# Patient Record
Sex: Male | Born: 1960 | Hispanic: No | Marital: Married | Smoking: Former smoker
Health system: Southern US, Community
[De-identification: ages and names within clinical notes are randomized; demographics above are authoritative.]

## PROBLEM LIST (undated history)

## (undated) DIAGNOSIS — K219 Gastro-esophageal reflux disease without esophagitis: Secondary | ICD-10-CM

## (undated) DIAGNOSIS — M1990 Unspecified osteoarthritis, unspecified site: Secondary | ICD-10-CM

## (undated) DIAGNOSIS — T4145XA Adverse effect of unspecified anesthetic, initial encounter: Secondary | ICD-10-CM

## (undated) DIAGNOSIS — T8859XA Other complications of anesthesia, initial encounter: Secondary | ICD-10-CM

## (undated) DIAGNOSIS — I10 Essential (primary) hypertension: Secondary | ICD-10-CM

## (undated) HISTORY — PX: FRACTURE SURGERY: SHX138

## (undated) HISTORY — PX: OTHER SURGICAL HISTORY: SHX169

---

## 2011-02-09 DIAGNOSIS — D126 Benign neoplasm of colon, unspecified: Secondary | ICD-10-CM

## 2019-01-26 NOTE — Patient Instructions (Addendum)
Ruben Peterson  01/26/2019   Your procedure is scheduled on: 02-08-19  Report to [facility] Main  Entrance  Report to admitting at        [place]  AM    Call this number if you have problems the morning of surgery [phone]    Remember: Do not eat food or drink liquids :After Midnight.  BRUSH YOUR TEETH MORNING OF SURGERY AND RINSE YOUR MOUTH OUT, NO CHEWING GUM CANDY OR MINTS.     Take these medicines the morning of surgery with A SIP OF WATER: NONE                                You may not have any metal on your body including hair pins and              piercings  Do not wear jewelry,  lotions, powders or perfumes, deodorant        .              Men may shave face and neck.   Do not bring valuables to the hospital. [place].  Contacts, dentures or bridgework may not be worn into surgery.  Leave suitcase in the car. After surgery it may be brought to your room.               Please read over the following fact sheets you were given: _____________________________________________________________________          [facility] - Preparing for Surgery Before surgery, you can play an important role.  Because skin is not sterile, your skin needs to be as free of germs as possible.  You can reduce the number of germs on your skin by washing with CHG (chlorahexidine gluconate) soap before surgery.  CHG is an antiseptic cleaner which kills germs and bonds with the skin to continue killing germs even after washing. Please DO NOT use if you have an allergy to CHG or antibacterial soaps.  If your skin becomes reddened/irritated stop using the CHG and inform your nurse when you arrive at Short Stay. Do not shave (including legs and underarms) for at least 48 hours prior to the first CHG shower.  You may shave your face/neck. Please follow these instructions carefully:  1.  Shower with CHG Soap the night before surgery  and the  morning of Surgery.  2.  If you choose to wash your hair, wash your hair first as usual with your  normal  shampoo.  3.  After you shampoo, rinse your hair and body thoroughly to remove the  shampoo.                           4.  Use CHG as you would any other liquid soap.  You can apply chg directly  to the skin and wash                       Gently with a scrungie or clean washcloth.  5.  Apply the CHG Soap to your body ONLY FROM THE NECK DOWN.   Do not use on face/ open  Wound or open sores. Avoid contact with eyes, ears mouth and genitals (private parts).                       Wash face,  Genitals (private parts) with your normal soap.             6.  Wash thoroughly, paying special attention to the area where your surgery  will be performed.  7.  Thoroughly rinse your body with warm water from the neck down.  8.  DO NOT shower/wash with your normal soap after using and rinsing off  the CHG Soap.                9.  Pat yourself dry with a clean towel.            10.  Wear clean pajamas.            11.  Place clean sheets on your bed the night of your first shower and do not  sleep with pets. Day of Surgery : Do not apply any lotions/deodorants the morning of surgery.  Please wear clean clothes to the hospital/surgery center.  FAILURE TO FOLLOW THESE INSTRUCTIONS MAY RESULT IN THE CANCELLATION OF YOUR SURGERY PATIENT SIGNATURE_________________________________  NURSE SIGNATURE__________________________________  ________________________________________________________________________  WHAT IS A BLOOD TRANSFUSION? Blood Transfusion Information  A transfusion is the replacement of blood or some of its parts. Blood is made up of multiple cells which provide different functions.  Red blood cells carry oxygen and are used for blood loss replacement.  White blood cells fight against infection.  Platelets control bleeding.  Plasma helps clot  blood.  Other blood products are available for specialized needs, such as hemophilia or other clotting disorders. BEFORE THE TRANSFUSION  Who gives blood for transfusions?   Healthy volunteers who are fully evaluated to make sure their blood is safe. This is blood bank blood. Transfusion therapy is the safest it has ever been in the practice of medicine. Before blood is taken from a donor, a complete history is taken to make sure that person has no history of diseases nor engages in risky social behavior (examples are intravenous drug use or sexual activity with multiple partners). The donor's travel history is screened to minimize risk of transmitting infections, such as malaria. The donated blood is tested for signs of infectious diseases, such as HIV and hepatitis. The blood is then tested to be sure it is compatible with you in order to minimize the chance of a transfusion reaction. If you or a relative donates blood, this is often done in anticipation of surgery and is not appropriate for emergency situations. It takes many days to process the donated blood. RISKS AND COMPLICATIONS Although transfusion therapy is very safe and saves many lives, the main dangers of transfusion include:   Getting an infectious disease.  Developing a transfusion reaction. This is an allergic reaction to something in the blood you were given. Every precaution is taken to prevent this. The decision to have a blood transfusion has been considered carefully by your caregiver before blood is given. Blood is not given unless the benefits outweigh the risks. AFTER THE TRANSFUSION  Right after receiving a blood transfusion, you will usually feel much better and more energetic. This is especially true if your red blood cells have gotten low (anemic). The transfusion raises the level of the red blood cells which carry oxygen, and this usually causes an energy increase.  The  nurse administering the transfusion will monitor  you carefully for complications. HOME CARE INSTRUCTIONS  No special instructions are needed after a transfusion. You may find your energy is better. Speak with your caregiver about any limitations on activity for underlying diseases you may have. SEEK MEDICAL CARE IF:   Your condition is not improving after your transfusion.  You develop redness or irritation at the intravenous (IV) site. SEEK IMMEDIATE MEDICAL CARE IF:  Any of the following symptoms occur over the next 12 hours:  Shaking chills.  You have a temperature by mouth above 102 F (38.9 C), not controlled by medicine.  Chest, back, or muscle pain.  People around you feel you are not acting correctly or are confused.  Shortness of breath or difficulty breathing.  Dizziness and fainting.  You get a rash or develop hives.  You have a decrease in urine output.  Your urine turns a dark color or changes to pink, red, or brown. Any of the following symptoms occur over the next 10 days:  You have a temperature by mouth above 102 F (38.9 C), not controlled by medicine.  Shortness of breath.  Weakness after normal activity.  The white part of the eye turns yellow (jaundice).  You have a decrease in the amount of urine or are urinating less often.  Your urine turns a dark color or changes to pink, red, or brown. Document Released: 11/27/2000 Document Revised: 02/22/2012 Document Reviewed: 07/16/2008 ExitCare Patient Information 2014 [place].  _______________________________________________________________________  Incentive Spirometer  An incentive spirometer is a tool that can help keep your lungs clear and active. This tool measures how well you are filling your lungs with each breath. Taking long deep breaths may help reverse or decrease the chance of developing breathing (pulmonary) problems (especially infection) following:  A long period of time when you are unable to move or be active. BEFORE THE  PROCEDURE   If the spirometer includes an indicator to show your best effort, your nurse or respiratory therapist will set it to a desired goal.  If possible, sit up straight or lean slightly forward. Try not to slouch.  Hold the incentive spirometer in an upright position. INSTRUCTIONS FOR USE  1. Sit on the edge of your bed if possible, or sit up as far as you can in bed or on a chair. 2. Hold the incentive spirometer in an upright position. 3. Breathe out normally. 4. Place the mouthpiece in your mouth and seal your lips tightly around it. 5. Breathe in slowly and as deeply as possible, raising the piston or the ball toward the top of the column. 6. Hold your breath for 3-5 seconds or for as long as possible. Allow the piston or ball to fall to the bottom of the column. 7. Remove the mouthpiece from your mouth and breathe out normally. 8. Rest for a few seconds and repeat Steps 1 through 7 at least 10 times every 1-2 hours when you are awake. Take your time and take a few normal breaths between deep breaths. 9. The spirometer may include an indicator to show your best effort. Use the indicator as a goal to work toward during each repetition. 10. After each set of 10 deep breaths, practice coughing to be sure your lungs are clear. If you have an incision (the cut made at the time of surgery), support your incision when coughing by placing a pillow or rolled up towels firmly against it. Once you are able to get  out of bed, walk around indoors and cough well. You may stop using the incentive spirometer when instructed by your caregiver.  RISKS AND COMPLICATIONS  Take your time so you do not get dizzy or light-headed.  If you are in pain, you may need to take or ask for pain medication before doing incentive spirometry. It is harder to take a deep breath if you are having pain. AFTER USE  Rest and breathe slowly and easily.  It can be helpful to keep track of a log of your progress. Your  caregiver can provide you with a simple table to help with this. If you are using the spirometer at home, follow these instructions: [place] IF:   You are having difficultly using the spirometer.  You have trouble using the spirometer as often as instructed.  Your pain medication is not giving enough relief while using the spirometer.  You develop fever of 100.5 F (38.1 C) or higher. SEEK IMMEDIATE MEDICAL CARE IF:   You cough up bloody sputum that had not been present before.  You develop fever of 102 F (38.9 C) or greater.  You develop worsening pain at or near the incision site. MAKE SURE YOU:   Understand these instructions.  Will watch your condition.  Will get help right away if you are not doing well or get worse. Document Released: 04/12/2007 Document Revised: 02/22/2012 Document Reviewed: 06/13/2007 [employer] Patient Information 2014 [place], [place].   ________________________________________________________________________

## 2019-01-30 ENCOUNTER — Encounter (HOSPITAL_COMMUNITY): Payer: Self-pay | Admitting: Student

## 2019-01-30 NOTE — H&P (Signed)
TOTAL HIP ADMISSION H&P  Patient is admitted for left total hip arthroplasty.  Subjective:  Chief Complaint: left hip pain  HPI: TAL KEMPKER, 58 y.o. male, has a history of pain and functional disability in the left hip(s) due to trauma and arthritis and patient has failed non-surgical conservative treatments for greater than 12 weeks to include activity modification.  Onset of symptoms was gradual starting >10 years ago with gradually worsening course since that time.The patient noted prior procedures of the hip to include fibular grafting on the left hip(s).  Patient currently rates pain in the left hip at 8 out of 10 with activity. Patient has worsening of pain with activity and weight bearing and pain that interfers with activities of daily living. 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 This condition presents safety issues increasing the risk of falls. There is no current active infection.  There are no active problems to display for this patient.  History reviewed. No pertinent past medical history.  History reviewed. No pertinent surgical history.  No current facility-administered medications for this encounter.    Current Outpatient Medications  Medication Sig Dispense Refill Last Dose  . chlorthalidone (HYGROTON) 25 MG tablet Take 25 mg by mouth daily.     Marland Kitchen HYDROcodone-acetaminophen (NORCO/VICODIN) 5-325 MG tablet Take 1 tablet by mouth every 6 (six) hours as needed for moderate pain.     Marland Kitchen oxymetazoline (AFRIN) 0.05 % nasal spray Place 1 spray into both nostrils 2 (two) times daily as needed for congestion.     . traMADol (ULTRAM) 50 MG tablet Take 50 mg by mouth every 6 (six) hours as needed (pain).      Allergies  Allergen Reactions  . Benazepril Anaphylaxis  . Heparin  Other (See Comments)    Clots blood     Social History   Tobacco Use  . Smoking status: Not on file  Substance Use Topics  . Alcohol use: Not on file    No family history on file.   ROS Constitutional Constitutional: no fever, no significant weight gain, no significant weight loss  HEENT Eyes: no dry eyes, no irritation, no vision change, no sore throat  Cardiovascular Cardiovascular: no chest pain, no palpitations  Respiratory Respiratory: no cough, no shortness of breath, No COPD  Gastrointestinal Gastrointestinal: no vomiting, no diarrhea, not vomiting blood  Genitourinary Genitourinary: no blood in urine, no difficulty urinating  Musculoskeletal Musculoskeletal: no swelling in Joints, Joint Pain  Integumentary Skin: no rashes, no varicose veins  Neurologic Neurologic: no numbness, no seizures, no dizziness, no difficulty with balance  Endocrine temperature intolerance (normal) to heat  Hematologic/Lymphatic Hematologic/Lymphatic no swollen glands, no bruising Objective:  Physical Exam Well nourished and well developed. Antalgic gait pattern to the left.  General: Alert and oriented x3, cooperative and pleasant, no acute distress. Head: normocephalic, atraumatic, neck supple. Eyes: EOMI. Respiratory: breath sounds clear in all fields, no wheezing, rales, or rhonchi. Cardiovascular: Regular rate and rhythm, no murmurs, gallops or rubs.  Abdomen: non-tender to palpation and soft, normoactive bowel sounds. Musculoskeletal: Left Hip Exam: ROM: Flexion to 90, Internal Rotation 0, External Rotation 0, and abduction 0 without discomfort.  There is no tenderness over the greater trochanter bursa.   Calves soft and nontender. Motor function intact in LE. Strength 5/5 LE bilaterally.  Neuro: Distal pulses  2+. Sensation to light touch intact in LE.  Vital signs in last 24 hours: BP: 142/82 mmHg Pulse: 72 bpm  Labs:   There is no height or weight on file  to calculate BMI.   Imaging Review Plain radiographs demonstrate severe degenerative joint disease of the left hip(s). The bone quality appears to be adequate for age and reported activity level.      Assessment/Plan:  End stage arthritis, left hip(s)  The patient history, physical examination, clinical judgement of the provider and imaging studies are consistent with end stage degenerative joint disease of the left hip(s) and total hip arthroplasty is deemed medically necessary. The treatment options including medical management, injection therapy, arthroscopy and arthroplasty were discussed at length. The risks and benefits of total hip arthroplasty were presented and reviewed. The risks due to aseptic loosening, infection, stiffness, dislocation/subluxation,  thromboembolic complications and other imponderables were discussed.  The patient acknowledged the explanation, agreed to proceed with the plan and consent was signed. Patient is being admitted for inpatient treatment for surgery, pain control, PT, OT, prophylactic antibiotics, VTE prophylaxis, progressive ambulation and ADL's and discharge planning.The patient is planning to be discharged home.  Therapy Plans: HEP Disposition: Home with wife Planned DVT Prophylaxis: Xarelto 10mg  daily DME needed: none PCP: Dr. 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 Patient wears hearing aids.   - Patient was instructed on what medications to stop prior to surgery. - Follow-up visit in 2 weeks with Dr. Wynelle Link - Begin physical therapy following surgery - Pre-operative lab work as pre-surgical testing - Prescriptions will be provided in hospital at time of discharge  Griffith Citron, PA-C Orthopedic Surgery [facility] [place]

## 2019-02-03 ENCOUNTER — Encounter (HOSPITAL_COMMUNITY)
Admission: RE | Admit: 2019-02-03 | Discharge: 2019-02-03 | Disposition: A | Discharge status: Home / Self Care | Payer: PRIVATE HEALTH INSURANCE | Source: Ambulatory Visit | Attending: Orthopedic Surgery | Admitting: Orthopedic Surgery

## 2019-02-03 ENCOUNTER — Other Ambulatory Visit: Payer: Self-pay

## 2019-02-03 ENCOUNTER — Encounter (HOSPITAL_COMMUNITY): Payer: Self-pay

## 2019-02-03 DIAGNOSIS — Z01818 Encounter for other preprocedural examination: Secondary | ICD-10-CM | POA: Diagnosis not present

## 2019-02-03 HISTORY — DX: Adverse effect of unspecified anesthetic, initial encounter: T41.45XA

## 2019-02-03 HISTORY — DX: Essential (primary) hypertension: I10

## 2019-02-03 HISTORY — DX: Unspecified osteoarthritis, unspecified site: M19.90

## 2019-02-03 HISTORY — DX: Other complications of anesthesia, initial encounter: T88.59XA

## 2019-02-03 HISTORY — DX: Gastro-esophageal reflux disease without esophagitis: K21.9

## 2019-02-03 LAB — COMPREHENSIVE METABOLIC PANEL
ALT: 29 U/L (ref 0–44)
AST: 21 U/L (ref 15–41)
Albumin: 3.9 g/dL (ref 3.5–5.0)
Alkaline Phosphatase: 87 U/L (ref 38–126)
Anion gap: 6 (ref 5–15)
BUN: 13 mg/dL (ref 6–20)
CO2: 27 mmol/L (ref 22–32)
Calcium: 9.1 mg/dL (ref 8.9–10.3)
Chloride: 106 mmol/L (ref 98–111)
Creatinine, Ser: 0.98 mg/dL (ref 0.61–1.24)
GFR calc Af Amer: >60 mL/min (ref >60)
GFR calc non Af Amer: >60 mL/min (ref >60)
Glucose, Bld: 87 mg/dL (ref 70–99)
Potassium: 3.5 mmol/L (ref 3.5–5.1)
Sodium: 139 mmol/L (ref 135–145)
Total Bilirubin: 0.3 mg/dL (ref 0.3–1.2)
Total Protein: 7.7 g/dL (ref 6.5–8.1)

## 2019-02-03 LAB — CBC
HCT: 47.7 % (ref 39.0–52.0)
Hemoglobin: 14.5 g/dL (ref 13.0–17.0)
MCH: 28.2 pg (ref 26.0–34.0)
MCHC: 30.4 g/dL (ref 30.0–36.0)
MCV: 92.8 fL (ref 80.0–100.0)
Platelets: 201 10*3/uL (ref 150–400)
RBC: 5.14 MIL/uL (ref 4.22–5.81)
RDW: 13.6 % (ref 11.5–15.5)
WBC: 4.6 10*3/uL (ref 4.0–10.5)
nRBC: 0 % (ref 0.0–0.2)

## 2019-02-03 LAB — URINALYSIS, ROUTINE W REFLEX MICROSCOPIC
Bilirubin Urine: NEGATIVE
Glucose, UA: NEGATIVE mg/dL
Hgb urine dipstick: NEGATIVE
Ketones, ur: NEGATIVE mg/dL
Leukocytes,Ua: NEGATIVE
Nitrite: NEGATIVE
Protein, ur: NEGATIVE mg/dL
Specific Gravity, Urine: 1.023 (ref 1.005–1.030)
pH: 6 (ref 5.0–8.0)

## 2019-02-03 LAB — PROTIME-INR
INR: 1.03
Prothrombin Time: 13.4 seconds (ref 11.4–15.2)

## 2019-02-03 LAB — SURGICAL PCR SCREEN
MRSA, PCR: NEGATIVE
Staphylococcus aureus: NEGATIVE

## 2019-02-03 LAB — ABO/RH: ABO/RH(D): A POS

## 2019-02-03 LAB — APTT: aPTT: 29 seconds (ref 24–36)

## 2019-02-07 ENCOUNTER — Encounter (HOSPITAL_COMMUNITY): Payer: Self-pay | Admitting: Certified Registered Nurse Anesthetist

## 2019-02-07 NOTE — Anesthesia Preprocedure Evaluation (Addendum)
Anesthesia Evaluation  Patient identified by MRN, date of birth, ID band Patient awake    Reviewed: Allergy & Precautions, NPO status , Patient's Chart, lab work & pertinent test results  Airway Mallampati: III  TM Distance: >3 FB Neck ROM: Full    Dental  (+) Upper Dentures, Lower Dentures   Pulmonary former smoker,    Pulmonary exam normal breath sounds clear to auscultation       Cardiovascular hypertension, Normal cardiovascular exam Rhythm:Regular Rate:Normal  ECG: NSR, rate 62   Neuro/Psych negative neurological ROS  negative psych ROS   GI/Hepatic Neg liver ROS, GERD  Controlled,  Endo/Other  negative endocrine ROS  Renal/GU negative Renal ROS     Musculoskeletal  (+) Arthritis ,   Abdominal (+) + obese,   Peds  Hematology negative hematology ROS (+)   Anesthesia Other Findings left hip osteoarthritis  Reproductive/Obstetrics                            Anesthesia Physical Anesthesia Plan  ASA: II  Anesthesia Plan: General   Post-op Pain Management:    Induction: Intravenous  PONV Risk Score and Plan: 3 and Ondansetron, Midazolam, Dexamethasone and Treatment may vary due to age or medical condition  Airway Management Planned: Oral ETT  Additional Equipment:   Intra-op Plan:   Post-operative Plan: Extubation in OR  Informed Consent: I have reviewed the patients History and Physical, chart, labs and discussed the procedure including the risks, benefits and alternatives for the proposed anesthesia with the patient or authorized representative who has indicated his/her understanding and acceptance.     Dental advisory given  Plan Discussed with: CRNA  Anesthesia Plan Comments:         Anesthesia Quick Evaluation

## 2019-02-08 ENCOUNTER — Inpatient Hospital Stay (HOSPITAL_COMMUNITY): Payer: PRIVATE HEALTH INSURANCE

## 2019-02-08 ENCOUNTER — Inpatient Hospital Stay (HOSPITAL_COMMUNITY)
Admission: RE | Admit: 2019-02-08 | Discharge: 2019-02-10 | DRG: 470 | Disposition: A | Discharge status: Home / Self Care | Payer: PRIVATE HEALTH INSURANCE | Attending: Orthopedic Surgery | Admitting: Orthopedic Surgery

## 2019-02-08 ENCOUNTER — Encounter (HOSPITAL_COMMUNITY): Payer: Self-pay

## 2019-02-08 ENCOUNTER — Other Ambulatory Visit: Payer: Self-pay

## 2019-02-08 ENCOUNTER — Inpatient Hospital Stay (HOSPITAL_COMMUNITY): Payer: PRIVATE HEALTH INSURANCE | Admitting: Certified Registered Nurse Anesthetist

## 2019-02-08 ENCOUNTER — Encounter (HOSPITAL_COMMUNITY)
Admission: RE | Disposition: A | Discharge status: Home / Self Care | Payer: Self-pay | Source: Home / Self Care | Attending: Orthopedic Surgery

## 2019-02-08 ENCOUNTER — Inpatient Hospital Stay (HOSPITAL_COMMUNITY): Payer: PRIVATE HEALTH INSURANCE | Admitting: Physician Assistant

## 2019-02-08 DIAGNOSIS — I10 Essential (primary) hypertension: Secondary | ICD-10-CM | POA: Diagnosis present

## 2019-02-08 DIAGNOSIS — R9431 Abnormal electrocardiogram [ECG] [EKG]: Secondary | ICD-10-CM | POA: Diagnosis present

## 2019-02-08 DIAGNOSIS — M879 Osteonecrosis, unspecified: Secondary | ICD-10-CM | POA: Diagnosis present

## 2019-02-08 DIAGNOSIS — Z96649 Presence of unspecified artificial hip joint: Secondary | ICD-10-CM

## 2019-02-08 DIAGNOSIS — M87052 Idiopathic aseptic necrosis of left femur: Secondary | ICD-10-CM | POA: Diagnosis not present

## 2019-02-08 DIAGNOSIS — Z6830 Body mass index (BMI) 30.0-30.9, adult: Secondary | ICD-10-CM

## 2019-02-08 DIAGNOSIS — E669 Obesity, unspecified: Secondary | ICD-10-CM | POA: Diagnosis present

## 2019-02-08 DIAGNOSIS — M25752 Osteophyte, left hip: Secondary | ICD-10-CM | POA: Diagnosis present

## 2019-02-08 DIAGNOSIS — K219 Gastro-esophageal reflux disease without esophagitis: Secondary | ICD-10-CM | POA: Diagnosis present

## 2019-02-08 DIAGNOSIS — Z888 Allergy status to other drugs, medicaments and biological substances status: Secondary | ICD-10-CM

## 2019-02-08 DIAGNOSIS — R000 Tachycardia, unspecified: Secondary | ICD-10-CM | POA: Diagnosis present

## 2019-02-08 DIAGNOSIS — Z79899 Other long term (current) drug therapy: Secondary | ICD-10-CM | POA: Diagnosis not present

## 2019-02-08 DIAGNOSIS — M1612 Unilateral primary osteoarthritis, left hip: Secondary | ICD-10-CM | POA: Diagnosis present

## 2019-02-08 DIAGNOSIS — M25552 Pain in left hip: Secondary | ICD-10-CM | POA: Diagnosis present

## 2019-02-08 HISTORY — PX: TOTAL HIP ARTHROPLASTY: SHX124

## 2019-02-08 LAB — CBC WITH DIFFERENTIAL/PLATELET
Abs Immature Granulocytes: 0.03 10*3/uL (ref 0.00–0.07)
Basophils Absolute: 0 10*3/uL (ref 0.0–0.1)
Basophils Relative: 0 %
EOS PCT: 0 %
Eosinophils Absolute: 0 10*3/uL (ref 0.0–0.5)
HCT: 39.7 % (ref 39.0–52.0)
HEMOGLOBIN: 12.2 g/dL — AB (ref 13.0–17.0)
Immature Granulocytes: 0 %
LYMPHS PCT: 9 %
Lymphs Abs: 0.7 10*3/uL (ref 0.7–4.0)
MCH: 27.9 pg (ref 26.0–34.0)
MCHC: 30.7 g/dL (ref 30.0–36.0)
MCV: 90.6 fL (ref 80.0–100.0)
Monocytes Absolute: 0.2 10*3/uL (ref 0.1–1.0)
Monocytes Relative: 2 %
Neutro Abs: 6.4 10*3/uL (ref 1.7–7.7)
Neutrophils Relative %: 89 %
Platelets: 185 10*3/uL (ref 150–400)
RBC: 4.38 MIL/uL (ref 4.22–5.81)
RDW: 13.8 % (ref 11.5–15.5)
WBC: 7.3 10*3/uL (ref 4.0–10.5)
nRBC: 0 % (ref 0.0–0.2)

## 2019-02-08 LAB — TYPE AND SCREEN
ABO/RH(D): A POS
Antibody Screen: NEGATIVE

## 2019-02-08 LAB — COMPREHENSIVE METABOLIC PANEL
ALBUMIN: 3.4 g/dL — AB (ref 3.5–5.0)
ALK PHOS: 71 U/L (ref 38–126)
ALT: 27 U/L (ref 0–44)
AST: 33 U/L (ref 15–41)
Anion gap: 9 (ref 5–15)
BUN: 12 mg/dL (ref 6–20)
CO2: 21 mmol/L — ABNORMAL LOW (ref 22–32)
Calcium: 8.5 mg/dL — ABNORMAL LOW (ref 8.9–10.3)
Chloride: 105 mmol/L (ref 98–111)
Creatinine, Ser: 1.13 mg/dL (ref 0.61–1.24)
GFR calc Af Amer: >60 mL/min (ref >60)
GFR calc non Af Amer: >60 mL/min (ref >60)
Glucose, Bld: 178 mg/dL — ABNORMAL HIGH (ref 70–99)
Potassium: 3.9 mmol/L (ref 3.5–5.1)
Sodium: 135 mmol/L (ref 135–145)
Total Bilirubin: 0.3 mg/dL (ref 0.3–1.2)
Total Protein: 7 g/dL (ref 6.5–8.1)

## 2019-02-08 LAB — TROPONIN I: Troponin I: <0.03 ng/mL (ref <0.03)

## 2019-02-08 SURGERY — ARTHROPLASTY, HIP, TOTAL,POSTERIOR APPROACH
Anesthesia: General | Site: Hip | Laterality: Left

## 2019-02-08 MED ORDER — BUPIVACAINE HCL (PF) 0.25 % IJ SOLN
INTRAMUSCULAR | Status: AC
  Filled 2019-02-08: qty 30

## 2019-02-08 MED ORDER — LACTATED RINGERS IV SOLN
INTRAVENOUS | Status: DC
  Administered 2019-02-08 (×2): via INTRAVENOUS

## 2019-02-08 MED ORDER — ACETAMINOPHEN 10 MG/ML IV SOLN
1000.0000 mg | Freq: Four times a day (QID) | INTRAVENOUS | Status: DC
  Administered 2019-02-08: 1000 mg via INTRAVENOUS
  Filled 2019-02-08: qty 100

## 2019-02-08 MED ORDER — HYDROMORPHONE HCL 2 MG/ML IJ SOLN
INTRAMUSCULAR | Status: AC
  Filled 2019-02-08: qty 1

## 2019-02-08 MED ORDER — PROPOFOL 10 MG/ML IV BOLUS
INTRAVENOUS | Status: DC | PRN
  Administered 2019-02-08: 180 mg via INTRAVENOUS

## 2019-02-08 MED ORDER — FENTANYL CITRATE (PF) 100 MCG/2ML IJ SOLN
INTRAMUSCULAR | Status: AC
  Filled 2019-02-08: qty 2

## 2019-02-08 MED ORDER — RIVAROXABAN 10 MG PO TABS
10.0000 mg | ORAL_TABLET | Freq: Every day | ORAL | Status: DC
  Administered 2019-02-09 – 2019-02-10 (×2): 10 mg via ORAL
  Filled 2019-02-08 (×2): qty 1

## 2019-02-08 MED ORDER — ONDANSETRON HCL 4 MG/2ML IJ SOLN: 4.0000 mg | Freq: Four times a day (QID) | INTRAMUSCULAR | Status: DC | PRN

## 2019-02-08 MED ORDER — ACETAMINOPHEN 500 MG PO TABS
500.0000 mg | ORAL_TABLET | Freq: Four times a day (QID) | ORAL | Status: AC
  Administered 2019-02-09 (×3): 500 mg via ORAL
  Filled 2019-02-08 (×3): qty 1

## 2019-02-08 MED ORDER — CEFAZOLIN SODIUM-DEXTROSE 2-4 GM/100ML-% IV SOLN
2.0000 g | Freq: Four times a day (QID) | INTRAVENOUS | Status: AC
  Administered 2019-02-08 – 2019-02-09 (×2): 2 g via INTRAVENOUS
  Filled 2019-02-08 (×2): qty 100

## 2019-02-08 MED ORDER — METHOCARBAMOL 500 MG IVPB - SIMPLE MED
500.0000 mg | Freq: Four times a day (QID) | INTRAVENOUS | Status: DC | PRN
  Administered 2019-02-08: 500 mg via INTRAVENOUS
  Filled 2019-02-08: qty 50
  Filled 2019-02-08: qty 500

## 2019-02-08 MED ORDER — TRANEXAMIC ACID-NACL 1000-0.7 MG/100ML-% IV SOLN
1000.0000 mg | INTRAVENOUS | Status: AC
  Administered 2019-02-08: 1000 mg via INTRAVENOUS
  Filled 2019-02-08: qty 100

## 2019-02-08 MED ORDER — KETOROLAC TROMETHAMINE 30 MG/ML IJ SOLN: 30.0000 mg | Freq: Once | INTRAMUSCULAR | Status: DC | PRN

## 2019-02-08 MED ORDER — POLYETHYLENE GLYCOL 3350 17 G PO PACK: 17.0000 g | PACK | Freq: Every day | ORAL | Status: DC | PRN

## 2019-02-08 MED ORDER — PROMETHAZINE HCL 25 MG/ML IJ SOLN
6.2500 mg | INTRAMUSCULAR | Status: DC | PRN
  Administered 2019-02-08: 6.25 mg via INTRAVENOUS

## 2019-02-08 MED ORDER — HYDROCODONE-ACETAMINOPHEN 5-325 MG PO TABS
1.0000 | ORAL_TABLET | ORAL | Status: DC | PRN
  Administered 2019-02-08: 2 via ORAL
  Administered 2019-02-09: 1 via ORAL
  Administered 2019-02-09: 2 via ORAL
  Filled 2019-02-08 (×3): qty 2

## 2019-02-08 MED ORDER — SODIUM CHLORIDE 0.9 % IV BOLUS
500.0000 mL | Freq: Once | INTRAVENOUS | Status: AC
  Administered 2019-02-08: 500 mL via INTRAVENOUS

## 2019-02-08 MED ORDER — SUGAMMADEX SODIUM 200 MG/2ML IV SOLN
INTRAVENOUS | Status: DC | PRN
  Administered 2019-02-08: 200 mg via INTRAVENOUS

## 2019-02-08 MED ORDER — METOCLOPRAMIDE HCL 5 MG/ML IJ SOLN: 5.0000 mg | Freq: Three times a day (TID) | INTRAMUSCULAR | Status: DC | PRN

## 2019-02-08 MED ORDER — SODIUM CHLORIDE 0.9 % IV SOLN
INTRAVENOUS | Status: DC
  Administered 2019-02-08 – 2019-02-09 (×2): via INTRAVENOUS

## 2019-02-08 MED ORDER — HYDROMORPHONE HCL 1 MG/ML IJ SOLN: 0.2500 mg | INTRAMUSCULAR | Status: DC | PRN

## 2019-02-08 MED ORDER — CHLORHEXIDINE GLUCONATE 4 % EX LIQD: 60.0000 mL | Freq: Once | CUTANEOUS | Status: DC

## 2019-02-08 MED ORDER — CHLORTHALIDONE 25 MG PO TABS
25.0000 mg | ORAL_TABLET | Freq: Every day | ORAL | Status: DC
  Administered 2019-02-09 – 2019-02-10 (×2): 25 mg via ORAL
  Filled 2019-02-08 (×2): qty 1

## 2019-02-08 MED ORDER — STERILE WATER FOR IRRIGATION IR SOLN
Status: DC | PRN
  Administered 2019-02-08: 2000 mL

## 2019-02-08 MED ORDER — PROMETHAZINE HCL 25 MG/ML IJ SOLN
INTRAMUSCULAR | Status: AC
  Filled 2019-02-08: qty 1

## 2019-02-08 MED ORDER — METHOCARBAMOL 500 MG PO TABS: 500.0000 mg | ORAL_TABLET | Freq: Four times a day (QID) | ORAL | Status: DC | PRN

## 2019-02-08 MED ORDER — LIDOCAINE 2% (20 MG/ML) 5 ML SYRINGE
INTRAMUSCULAR | Status: AC
  Filled 2019-02-08: qty 5

## 2019-02-08 MED ORDER — ACETAMINOPHEN 500 MG PO TABS: 1000.0000 mg | ORAL_TABLET | Freq: Once | ORAL | Status: DC

## 2019-02-08 MED ORDER — FENTANYL CITRATE (PF) 100 MCG/2ML IJ SOLN
INTRAMUSCULAR | Status: DC | PRN
  Administered 2019-02-08 (×3): 100 ug via INTRAVENOUS

## 2019-02-08 MED ORDER — HYDROMORPHONE HCL 1 MG/ML IJ SOLN
INTRAMUSCULAR | Status: DC | PRN
  Administered 2019-02-08 (×2): 0.5 mg via INTRAVENOUS
  Administered 2019-02-08 (×2): 1 mg via INTRAVENOUS
  Administered 2019-02-08 (×2): 0.5 mg via INTRAVENOUS

## 2019-02-08 MED ORDER — DOCUSATE SODIUM 100 MG PO CAPS
100.0000 mg | ORAL_CAPSULE | Freq: Two times a day (BID) | ORAL | Status: DC
  Administered 2019-02-08 – 2019-02-10 (×4): 100 mg via ORAL
  Filled 2019-02-08 (×4): qty 1

## 2019-02-08 MED ORDER — ONDANSETRON HCL 4 MG PO TABS: 4.0000 mg | ORAL_TABLET | Freq: Four times a day (QID) | ORAL | Status: DC | PRN

## 2019-02-08 MED ORDER — ONDANSETRON HCL 4 MG/2ML IJ SOLN
INTRAMUSCULAR | Status: DC | PRN
  Administered 2019-02-08: 4 mg via INTRAVENOUS

## 2019-02-08 MED ORDER — ONDANSETRON HCL 4 MG/2ML IJ SOLN
INTRAMUSCULAR | Status: AC
  Filled 2019-02-08: qty 2

## 2019-02-08 MED ORDER — KETOROLAC TROMETHAMINE 30 MG/ML IJ SOLN
INTRAMUSCULAR | Status: AC
  Filled 2019-02-08: qty 1

## 2019-02-08 MED ORDER — 0.9 % SODIUM CHLORIDE (POUR BTL) OPTIME
TOPICAL | Status: DC | PRN
  Administered 2019-02-08: 1000 mL

## 2019-02-08 MED ORDER — DEXAMETHASONE SODIUM PHOSPHATE 10 MG/ML IJ SOLN
8.0000 mg | Freq: Once | INTRAMUSCULAR | Status: AC
  Administered 2019-02-08: 10 mg via INTRAVENOUS

## 2019-02-08 MED ORDER — CEFAZOLIN SODIUM-DEXTROSE 2-4 GM/100ML-% IV SOLN
2.0000 g | INTRAVENOUS | Status: AC
  Administered 2019-02-08: 2 g via INTRAVENOUS
  Filled 2019-02-08: qty 100

## 2019-02-08 MED ORDER — MENTHOL 3 MG MT LOZG
1.0000 | LOZENGE | OROMUCOSAL | Status: DC | PRN
  Filled 2019-02-08: qty 9

## 2019-02-08 MED ORDER — PROPOFOL 10 MG/ML IV BOLUS
INTRAVENOUS | Status: AC
  Filled 2019-02-08: qty 20

## 2019-02-08 MED ORDER — MORPHINE SULFATE (PF) 2 MG/ML IV SOLN: 0.5000 mg | INTRAVENOUS | Status: DC | PRN

## 2019-02-08 MED ORDER — SUGAMMADEX SODIUM 200 MG/2ML IV SOLN
INTRAVENOUS | Status: AC
  Filled 2019-02-08: qty 2

## 2019-02-08 MED ORDER — METOCLOPRAMIDE HCL 5 MG PO TABS: 5.0000 mg | ORAL_TABLET | Freq: Three times a day (TID) | ORAL | Status: DC | PRN

## 2019-02-08 MED ORDER — BUPIVACAINE HCL (PF) 0.25 % IJ SOLN
INTRAMUSCULAR | Status: DC | PRN
  Administered 2019-02-08: 30 mL

## 2019-02-08 MED ORDER — SODIUM CHLORIDE 0.9 % IV SOLN: INTRAVENOUS | Status: DC

## 2019-02-08 MED ORDER — PHENOL 1.4 % MT LIQD
1.0000 | OROMUCOSAL | Status: DC | PRN
  Filled 2019-02-08: qty 177

## 2019-02-08 MED ORDER — OXYCODONE HCL 5 MG/5ML PO SOLN: 5.0000 mg | Freq: Once | ORAL | Status: DC | PRN

## 2019-02-08 MED ORDER — FLEET ENEMA 7-19 GM/118ML RE ENEM: 1.0000 | ENEMA | Freq: Once | RECTAL | Status: DC | PRN

## 2019-02-08 MED ORDER — ROCURONIUM BROMIDE 100 MG/10ML IV SOLN
INTRAVENOUS | Status: AC
  Filled 2019-02-08: qty 1

## 2019-02-08 MED ORDER — DEXAMETHASONE SODIUM PHOSPHATE 10 MG/ML IJ SOLN
10.0000 mg | Freq: Once | INTRAMUSCULAR | Status: AC
  Administered 2019-02-09: 10 mg via INTRAVENOUS
  Filled 2019-02-08: qty 1

## 2019-02-08 MED ORDER — BISACODYL 10 MG RE SUPP: 10.0000 mg | Freq: Every day | RECTAL | Status: DC | PRN

## 2019-02-08 MED ORDER — ROCURONIUM BROMIDE 100 MG/10ML IV SOLN
INTRAVENOUS | Status: DC | PRN
  Administered 2019-02-08: 20 mg via INTRAVENOUS
  Administered 2019-02-08: 10 mg via INTRAVENOUS
  Administered 2019-02-08: 50 mg via INTRAVENOUS

## 2019-02-08 MED ORDER — METHOCARBAMOL 500 MG IVPB - SIMPLE MED
INTRAVENOUS | Status: AC
  Filled 2019-02-08: qty 50

## 2019-02-08 MED ORDER — LIDOCAINE 2% (20 MG/ML) 5 ML SYRINGE
INTRAMUSCULAR | Status: DC | PRN
  Administered 2019-02-08: 60 mg via INTRAVENOUS

## 2019-02-08 MED ORDER — OXYCODONE HCL 5 MG PO TABS: 5.0000 mg | ORAL_TABLET | Freq: Once | ORAL | Status: DC | PRN

## 2019-02-08 MED ORDER — DIPHENHYDRAMINE HCL 12.5 MG/5ML PO ELIX: 12.5000 mg | ORAL_SOLUTION | ORAL | Status: DC | PRN

## 2019-02-08 MED ORDER — HYDROCODONE-ACETAMINOPHEN 7.5-325 MG PO TABS
1.0000 | ORAL_TABLET | ORAL | Status: DC | PRN
  Administered 2019-02-08 – 2019-02-10 (×5): 2 via ORAL
  Filled 2019-02-08 (×5): qty 2

## 2019-02-08 MED ORDER — PROPOFOL 10 MG/ML IV BOLUS
INTRAVENOUS | Status: AC
  Filled 2019-02-08: qty 60

## 2019-02-08 MED ORDER — HYDROMORPHONE HCL 1 MG/ML IJ SOLN
INTRAMUSCULAR | Status: AC
  Filled 2019-02-08: qty 2

## 2019-02-08 SURGICAL SUPPLY — 69 items
BAG DECANTER FOR FLEXI CONT (MISCELLANEOUS) ×3 IMPLANT
BAG ZIPLOCK 12X15 (MISCELLANEOUS) IMPLANT
BIT DRILL 2.8X128 (BIT) ×2 IMPLANT
BIT DRILL 2.8X128MM (BIT) ×1
BLADE EXTENDED COATED 6.5IN (ELECTRODE) ×3 IMPLANT
BLADE SAW SAG 73X25 THK (BLADE) ×1
BLADE SAW SGTL 73X25 THK (BLADE) ×2 IMPLANT
BLADE SURG SZ10 CARB STEEL (BLADE) ×6 IMPLANT
BUR EGG DIAMOND 4.0 (BURR) ×2 IMPLANT
BUR EGG DIAMOND 4.0MM (BURR) ×1
BUR MICRO 7.0 ROUND ×3 IMPLANT
CLOSURE WOUND 1/2 X4 (GAUZE/BANDAGES/DRESSINGS) ×2
COVER SURGICAL LIGHT HANDLE (MISCELLANEOUS) ×3 IMPLANT
COVER WAND RF STERILE (DRAPES) IMPLANT
CUP ACET PINNACLE SECTR 56MM (Hips) ×1 IMPLANT
DECANTER SPIKE VIAL GLASS SM (MISCELLANEOUS) ×3 IMPLANT
DRAPE INCISE IOBAN 66X45 STRL (DRAPES) ×3 IMPLANT
DRAPE ORTHO SPLIT 77X108 STRL (DRAPES) ×4
DRAPE POUCH INSTRU U-SHP 10X18 (DRAPES) ×3 IMPLANT
DRAPE SURG ORHT 6 SPLT 77X108 (DRAPES) ×2 IMPLANT
DRAPE U-SHAPE 47X51 STRL (DRAPES) ×3 IMPLANT
DRSG ADAPTIC 3X8 NADH LF (GAUZE/BANDAGES/DRESSINGS) ×3 IMPLANT
DRSG MEPILEX BORDER 4X12 (GAUZE/BANDAGES/DRESSINGS) ×3 IMPLANT
DRSG MEPILEX BORDER 4X4 (GAUZE/BANDAGES/DRESSINGS) ×3 IMPLANT
DRSG MEPILEX BORDER 4X8 (GAUZE/BANDAGES/DRESSINGS) ×3 IMPLANT
DURAPREP 26ML APPLICATOR (WOUND CARE) ×3 IMPLANT
ELECT REM PT RETURN 15FT ADLT (MISCELLANEOUS) ×3 IMPLANT
EVACUATOR 1/8 PVC DRAIN (DRAIN) ×3 IMPLANT
FACESHIELD WRAPAROUND (MASK) ×12 IMPLANT
GAUZE SPONGE 4X4 12PLY STRL (GAUZE/BANDAGES/DRESSINGS) ×3 IMPLANT
GLOVE BIO SURGEON STRL SZ7 (GLOVE) ×3 IMPLANT
GLOVE BIO SURGEON STRL SZ8 (GLOVE) ×3 IMPLANT
GLOVE BIOGEL PI IND STRL 7.0 (GLOVE) ×2 IMPLANT
GLOVE BIOGEL PI IND STRL 8 (GLOVE) ×1 IMPLANT
GLOVE BIOGEL PI INDICATOR 7.0 (GLOVE) ×4
GLOVE BIOGEL PI INDICATOR 8 (GLOVE) ×2
GOWN STRL REUS W/TWL LRG LVL3 (GOWN DISPOSABLE) ×6 IMPLANT
HEAD CERAMIC BIOLOX 36 +3 (Hips) ×3 IMPLANT
IMMOBILIZER KNEE 20 (SOFTGOODS) ×3
IMMOBILIZER KNEE 20 THIGH 36 (SOFTGOODS) ×1 IMPLANT
KIT BASIN OR (CUSTOM PROCEDURE TRAY) ×3 IMPLANT
LINER MARATHON 4 NEUTRAL 36X56 (Hips) ×3 IMPLANT
MANIFOLD NEPTUNE II (INSTRUMENTS) ×3 IMPLANT
NDL SAFETY ECLIPSE 18X1.5 (NEEDLE) ×2 IMPLANT
NEEDLE HYPO 18GX1.5 SHARP (NEEDLE) ×4
NS IRRIG 1000ML POUR BTL (IV SOLUTION) ×3 IMPLANT
PACK TOTAL JOINT (CUSTOM PROCEDURE TRAY) ×3 IMPLANT
PADDING CAST COTTON 6X4 STRL (CAST SUPPLIES) ×3 IMPLANT
PASSER SUT SWANSON 36MM LOOP (INSTRUMENTS) ×3 IMPLANT
PINNACLE SECTOR CUP 56MM (Hips) ×3 IMPLANT
PROTECTOR NERVE ULNAR (MISCELLANEOUS) ×3 IMPLANT
ROUND BUR 7.0 ×3 IMPLANT
SLEEVE FEM PROX 22D SML (Hips) ×3 IMPLANT
STEM FEM SROM STD 17X22 36+12 (Hips) ×3 IMPLANT
STRIP CLOSURE SKIN 1/2X4 (GAUZE/BANDAGES/DRESSINGS) ×4 IMPLANT
SUT ETHIBOND NAB CT1 #1 30IN (SUTURE) ×6 IMPLANT
SUT MNCRL AB 4-0 PS2 18 (SUTURE) ×3 IMPLANT
SUT STRATAFIX 0 PDS 27 VIOLET (SUTURE) ×3
SUT VIC AB 2-0 CT1 27 (SUTURE) ×6
SUT VIC AB 2-0 CT1 TAPERPNT 27 (SUTURE) ×3 IMPLANT
SUTURE STRATFX 0 PDS 27 VIOLET (SUTURE) ×1 IMPLANT
SYR 20CC LL (SYRINGE) ×3 IMPLANT
SYR 50ML LL SCALE MARK (SYRINGE) IMPLANT
Stryker 4.0mm Egg Diamond ×3 IMPLANT
TOWEL OR 17X26 10 PK STRL BLUE (TOWEL DISPOSABLE) ×6 IMPLANT
TOWEL OR NON WOVEN STRL DISP B (DISPOSABLE) ×3 IMPLANT
TRAY FOLEY MTR SLVR 16FR STAT (SET/KITS/TRAYS/PACK) ×3 IMPLANT
WATER STERILE IRR 1000ML POUR (IV SOLUTION) ×3 IMPLANT
YANKAUER SUCT BULB TIP 10FT TU (MISCELLANEOUS) ×3 IMPLANT

## 2019-02-08 NOTE — Interval H&P Note (Signed)
History and Physical Interval Note:  02/08/2019 6:31 AM  Benay Pike Sr.  has presented today for surgery, with the diagnosis of left hip osteoarthritis  The various methods of treatment have been discussed with the patient and family. After consideration of risks, benefits and other options for treatment, the patient has consented to  Procedure(s) with comments: TOTAL HIP ARTHROPLASTY (Posterior) (Left) - 157min (please move other cases down 30min for Dr. Wynelle Link) as a surgical intervention .  The patient's history has been reviewed, patient examined, no change in status, stable for surgery.  I have reviewed the patient's chart and labs.  Questions were answered to the patient's satisfaction.     Pilar Plate Shiya Fogelman

## 2019-02-08 NOTE — Anesthesia Postprocedure Evaluation (Signed)
Anesthesia Post Note  Patient: Ruben Pike Sr.  Procedure(s) Performed: CONVERSION OF PREVIOUS HIP SURGERY TO TOTAL HIP ARTHROPLASTY (Posterior) (Left Hip)     Patient location during evaluation: PACU Anesthesia Type: General Level of consciousness: awake and alert Pain management: pain level controlled Vital Signs Assessment: post-procedure vital signs reviewed and stable Respiratory status: spontaneous breathing, nonlabored ventilation, respiratory function stable and patient connected to nasal cannula oxygen Cardiovascular status: blood pressure returned to baseline and stable Postop Assessment: no apparent nausea or vomiting Anesthetic complications: no    Last Vitals:  Vitals:   02/08/19 1337 02/08/19 1507  BP: (!) 144/94 (!) 135/100  Pulse: 90 96  Resp: 16 16  Temp: (!) 36.3 C   SpO2:  97%    Last Pain:  Vitals:   02/08/19 1553  TempSrc:   PainSc: 4                  Ryan P Ellender

## 2019-02-08 NOTE — Transfer of Care (Signed)
Immediate Anesthesia Transfer of Care Note  Patient: Ruben Pike Sr.  Procedure(s) Performed: CONVERSION OF PREVIOUS HIP SURGERY TO TOTAL HIP ARTHROPLASTY (Posterior) (Left Hip)  Patient Location: PACU  Anesthesia Type:General  Level of Consciousness: awake, alert  and oriented  Airway & Oxygen Therapy: Patient Spontanous Breathing and Patient connected to face mask oxygen  Post-op Assessment: Report given to RN and Post -op Vital signs reviewed and stable  Post vital signs: Reviewed and stable  Last Vitals:  Vitals Value Taken Time  BP 147/101 02/08/2019 11:02 AM  Temp    Pulse 85 02/08/2019 11:06 AM  Resp 10 02/08/2019 11:06 AM  SpO2 100 % 02/08/2019 11:06 AM  Vitals shown include unvalidated device data.  Last Pain:  Vitals:   02/08/19 0657  TempSrc:   PainSc: 0-No pain         Complications: No apparent anesthesia complications

## 2019-02-08 NOTE — Anesthesia Procedure Notes (Signed)
Procedure Name: Intubation Date/Time: 02/08/2019 8:28 AM Performed by: [place] Waqas Bruhl C, CRNA Pre-anesthesia Checklist: Patient identified, Emergency Drugs available, Suction available and Patient being monitored Patient Re-evaluated:Patient Re-evaluated prior to induction Oxygen Delivery Method: Circle system utilized Preoxygenation: Pre-oxygenation with 100% oxygen Induction Type: IV induction Ventilation: Mask ventilation without difficulty Laryngoscope Size: Mac, Glidescope and 3 Grade View: Grade I Tube type: Oral Number of attempts: 1 Airway Equipment and Method: Rigid stylet Placement Confirmation: ETT inserted through vocal cords under direct vision,  positive ETCO2 and breath sounds checked- equal and bilateral Secured at: 22 cm Tube secured with: Tape Dental Injury: Teeth and Oropharynx as per pre-operative assessment  Difficulty Due To: Difficulty was anticipated Comments: Pt with hx prior jaw surgery and limited mouth opening, therefore elective glidescope intubation performed easily and atraumatic

## 2019-02-08 NOTE — Consult Note (Signed)
[facility] Medical Consultation  Ruben Peterson PPI:[id number] DOB: 1961-09-28 DOA: 02/08/2019 PCP: [name], MD   Requesting physician: Dr. Lyla Glassing Date of consultation: 6:55 PM 02/08/19   Reason for consultation: tachycardia, abnormal ECG   Impression/Recommendations  Problems:    Tachycardia -sinus tachycardia in the setting of pain.  Administer IV fluids and pain control tachycardia has improved and currently heart rate back down to 80s.  No associated chest pain or shortness of breath. For  Completion we will obtain TSH Would continue to monitor and treat cause of tachycardia   Abnormal ECG -specific EKG changes in the setting of tachycardia resolved after tachycardia has improved.  Patient states he has no exertional chest pain or shortness of breath and has been fairly active no history of hyperlipidemia. Given dynamic changes on EKG rate dependent will monitor on telemetry check cardiac enzymes first troponin unremarkable.  Electrolytes within normal limits Given abnormal EKG check echogram probably would benefit from further cardiac evaluation will email cardiology let them know about the pt.     Osteonecrosis of hip ([place]) as  Per primary team   HTN -okay to hold off on BP meds for tonight pending further cardiac work-up may need different blood pressure medications choice at discharge   I will followup again tomorrow. Please contact me if I can be of assistance in the meanwhile. Thank you for this consultation.  Chief Complaint: tachycardia  HPI:  58 yo w HTN of Hypertension not on any meds, left hip osteoarthritis no Hx CAD Allergic to Heparin today undergone total hip arthroplasty. Since surgery he has been having some hip pain.  He also noted to be somewhat tachycardic up to 110 EKG was obtained that showed flipped T waves nonspecific changes he is not having any chest pain or shortness of breath only reporting hip pain.  Denies any history of tobacco  abuse alcohol abuse not taking any medications although have had history of hypertension.  No family history of coronary artery disease and he himself has no history of coronary artery disease. Review of Systems:    Review of Systems:    Pertinent positives include: hip pain  Constitutional:  No weight loss, night sweats, Fevers, chills, fatigue, weight loss  HEENT:  No headaches, Difficulty swallowing,Tooth/dental problems,Sore throat,  No sneezing, itching, ear ache, nasal congestion, post nasal drip,  Cardio-vascular:  No chest pain, Orthopnea, PND, anasarca, dizziness, palpitations.no Bilateral lower extremity swelling  GI:  No heartburn, indigestion, abdominal pain, nausea, vomiting, diarrhea, change in bowel habits, loss of appetite, melena, blood in stool, hematemesis Resp: no shortness of breath at rest or dyspnea on exertion  No excess mucus, no productive cough, No non-productive cough, No coughing up of blood.No change in color of mucus.No wheezing. Skin:  no rash or lesions. No jaundice GU:  no dysuria, change in color of urine, no urgency or frequency. No straining to urinate.  No flank pain.  Musculoskeletal:    No decreased range of motion. No back pain.  Psych:  No change in mood or affect. No depression or anxiety. No memory loss.  Neuro: no localizing neurological complaints, no tingling, no weakness, no double vision, no gait abnormality, no slurred speech, no confusion  All systems reviewed and apart from [place] all are negative  Past Medical History:  Diagnosis Date  . Arthritis   . Complication of anesthesia    pt. states he remember being intubated at [facility] and complains of  food feeling stuck  at times  . GERD (gastroesophageal reflux disease)    diet related  . Hypertension    Past Surgical History:  Procedure Laterality Date  . FRACTURE SURGERY     left  x3  with bone graft and screw  . jaw wired shu from fracture    . toes stretched left foot      Social History:  reports that he has quit smoking. He quit after 10.00 years of use. He has never used smokeless tobacco. He reports that he does not drink alcohol or use drugs.  Allergies  Allergen Reactions  . Benazepril Anaphylaxis  . Heparin Other (See Comments)    Clots blood    History reviewed. No pertinent family history.  Prior to Admission medications   Medication Sig Start Date End Date Taking? Authorizing Provider  chlorthalidone (HYGROTON) 25 MG tablet Take 25 mg by mouth daily.   Yes [provider]  HYDROcodone-acetaminophen (NORCO/VICODIN) 5-325 MG tablet Take 1 tablet by mouth every 6 (six) hours as needed for moderate pain.   Yes [provider]  traMADol (ULTRAM) 50 MG tablet Take 50 mg by mouth every 6 (six) hours as needed (pain).   Yes [provider]  oxymetazoline (AFRIN) 0.05 % nasal spray Place 1 spray into both nostrils 2 (two) times daily as needed for congestion.    [provider]   Physical Exam: Blood pressure (!) 130/97, pulse 100, temperature 98.5 F (36.9 C), temperature source Oral, resp. rate 16, height 5\' 7"  (1.702 m), weight 87.1 kg, SpO2 98 %. Vitals:   02/08/19 2152 02/09/19 0007  BP: 107/80 103/76  Pulse: 72 78  Resp: 17 18  Temp: 98.8 F (37.1 C) 98 F (36.7 C)  SpO2: 98% 96%    1. General:  in No Acute distress   well -appearing 2. Psychological: Alert and  Oriented 3. Head/ENT:    Dry Mucous Membranes                          Head Non traumatic, neck supple                           Poor Dentition 4. SKIN:   decreased Skin turgor,  Skin clean Dry and intact no rash 5. Heart: Regular rate and rhythm no  Murmur, no Rub or gallop 6. Lungs:  Clear to auscultation bilaterally, no wheezes or crackles   7. Abdomen: Soft,   Non distended  Obese bowel sounds present 8. Lower extremities: no clubbing, cyanosis, trace  edema 9. Neurologically Grossly intact, moving all 4 extremities equally  10.  MSK: Normal range of motion by recent surgical intervention patient is currently nonweightbearing   Labs on Admission:  Basic Metabolic Panel: Recent Labs  Lab 02/03/19 0830  NA 139  K 3.5  CL 106  CO2 27  GLUCOSE 87  BUN 13  CREATININE 0.98  CALCIUM 9.1   Liver Function Tests: Recent Labs  Lab 02/03/19 0830  AST 21  ALT 29  ALKPHOS 87  BILITOT 0.3  PROT 7.7  ALBUMIN 3.9   No results for input(s): LIPASE, AMYLASE in the last 168 hours. No results for input(s): AMMONIA in the last 168 hours. CBC: Recent Labs  Lab 02/03/19 0830  WBC 4.6  HGB 14.5  HCT 47.7  MCV 92.8  PLT 201   Cardiac Enzymes: No results for input(s): CKTOTAL, CKMB, CKMBINDEX, TROPONINI in  the last 168 hours. BNP: Invalid input(s): POCBNP CBG: No results for input(s): GLUCAP in the last 168 hours.  Radiological Exams on Admission: Dg Pelvis Portable  Result Date: 02/08/2019 CLINICAL DATA:  Left hip replacement, postoperative assessment EXAM: PORTABLE PELVIS 1-2 VIEWS COMPARISON:  01/19/2018 FINDINGS: A low centered frontal projection of the pelvis includes the entire a left hip prosthesis which appears well positioned an without periprosthetic fracture or immediate complicating feature. A drain is in place along with expected gas in the soft tissues. IMPRESSION: 1. Left total hip prosthesis placement without early complicating feature identified. Electronically Signed   By: Van Clines M.D.   On: 02/08/2019 13:53    EKG: Independently reviewed.  Initial EKG showing heart rate of 107 sinus rhythm with abnormal T waves and ST segment changes Repeat EKG showing heart rate of 83 sinus rhythm with improved ST segment changes and T waves  Time spent: 65 min  Ruben Peterson [facility]    If 7PM-7AM, please contact night-coverage [email or web address] Password [facility] 02/08/2019, 6:54 PM

## 2019-02-08 NOTE — Op Note (Signed)
Pre-operative diagnosis- Osteonecrosis Left hip  Post-operative diagnosis- Osteonecrosis  Left hip  Procedure-  Conversion of previous hip surgery to  LeftTotal Hip Arthroplasty  Surgeon- Dione Plover. Joie Reamer, MD  Assistant- Griffith Citron, PA-C   Anesthesia  General  EBL- 550 mL   Drain Hemovac   Complication- None  Condition-PACU - hemodynamically stable.   Brief Clinical Note- Ruben Peterson. is a 58 y.o. 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 Pain occurs with activity and rest including pain at night. He has tried analgesics, protected weight bearing and rest without benefit. Pain is too severe to attempt physical therapy. Radiographs demonstrate bone on bone arthritis with subchondral cyst formation. He presents now for left THA.  Procedure in detail-   The patient is brought into the operating room and placed on the operating table. After successful administration of General  anesthesia, the patient is placed in the  Right lateral decubitus position with the  Left side up and held in place with the hip positioner. The lower extremity is isolated from the perineum with plastic drapes and time-out is performed by the surgical team. The lower extremity is then prepped and draped in the usual sterile fashion. A short posterolateral incision is made with a ten blade through the subcutaneous tissue to the level of the fascia lata which is incised in line with the skin incision. The sciatic nerve is palpated and protected and the short external rotators and capsule are isolated from the femur. The hip is then dislocated and the center of the femoral head is marked. A trial prosthesis is placed such that the trial head corresponds to the center of the patients' native femoral head. The resection level is marked on the femoral neck and the resection is made with an oscillating saw. The femoral head is removed and femoral retractors  placed to gain access to the femoral canal. He had a previous vascularized fibular graft that was blocking access to the femoral canal. A high speed burr is used to decorticate the graft to gain access to the femoral canal and to allow for lateralization of the femoral stem. A screw is removed also from the proximal femur to allow access into the canal.      The canal finder is passed into the femoral canal and the canal is thoroughly irrigated with sterile saline to remove the fatty contents. Axial reaming is performed to 17.5  mm, proximal reaming to 22 B  and the sleeve machined to a small. A 22 B small trial sleeve is placed into the proximal femur.      The femur is then retracted anteriorly to gain acetabular exposure. Acetabular retractors are placed and the labrum and osteophytes are removed, Acetabular reaming is performed to 55  mm and a 56  mm Pinnacle acetabular shell is placed in anatomic position with excellent purchase. Additional dome screws were not needed. The permanent 36 mm neutral + 4 Marathon liner is placed into the acetabular shell.      The trial femur is then placed into the femoral canal. The size is 22 x 17  stem with a 36 + 12  neck and a 36 + 3 head with the neck version matching  the patients' native anteversion. The hip is reduced with excellent stability with full extension and full external rotation, 70 degrees flexion with 40 degrees adduction and 90 degrees internal rotation and 90 degrees of flexion with 70  degrees of internal rotation. The operative leg is placed on top of the non-operative leg and the leg lengths are found to be equal. The trials are then removed and the permanent implant of the same size is impacted into the femoral canal. The 32 + 3 ceramic, metal) femoral head of the same size as the trial is placed and the hip is reduced with the same stability parameters. The operative leg is again placed on top of the non-operative leg and the leg lengths are found to  be equal.      The wound is then copiously irrigated with saline solution and the capsule and short external rotators are re-attached to the femur through drill holes with Ethibond suture. The fascia lata is closed over a hemovac drain with 0 Stratofix suture and the fascia lata, gluteal muscles and subcutaneous tissues are injected with 30 ml of .25% Marcaine. The subcutaneous tissues are closed 2-0 vicryl and the subcuticular layer closed with running 4-0 Monocryl. The drain is hooked to suction, incision cleaned and dried, and steri-srips and a bulky sterile dressing applied. The limb is placed into a knee immobilizer and the patient is awakened and transported to recovery in stable condition.      Please note that a surgical assistant was a medical necessity for this procedure in order to perform it in a safe and expeditious manner. The assistant was necessary to provide retraction to the vital neurovascular structures and to retract and position the limb to allow for anatomic placement of the prosthetic components.  Dione Plover Lashawne Dura, MD    02/08/2019, 10:28 AM

## 2019-02-08 NOTE — Progress Notes (Signed)
Noted patient's pulse rate elevated and sustaining at 115 with frequent jumps into the 120s. Patient denies history of tachycardia, does not take medications for pulse. Reports pain as dull and tolerable. Dr Wynelle Link present in room shortly after, informed about pulse and BP. Rec'd order for EKG. On call physician notified of results. Hospitalist consulted. Will cont to monitor.
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
## 2019-02-09 ENCOUNTER — Encounter (HOSPITAL_COMMUNITY): Payer: Self-pay | Admitting: Cardiology

## 2019-02-09 ENCOUNTER — Inpatient Hospital Stay (HOSPITAL_COMMUNITY): Payer: PRIVATE HEALTH INSURANCE

## 2019-02-09 DIAGNOSIS — R000 Tachycardia, unspecified: Secondary | ICD-10-CM

## 2019-02-09 DIAGNOSIS — R9431 Abnormal electrocardiogram [ECG] [EKG]: Secondary | ICD-10-CM

## 2019-02-09 DIAGNOSIS — M87052 Idiopathic aseptic necrosis of left femur: Secondary | ICD-10-CM

## 2019-02-09 LAB — BASIC METABOLIC PANEL
Anion gap: 7 (ref 5–15)
BUN: 13 mg/dL (ref 6–20)
CHLORIDE: 106 mmol/L (ref 98–111)
CO2: 24 mmol/L (ref 22–32)
Calcium: 8.2 mg/dL — ABNORMAL LOW (ref 8.9–10.3)
Creatinine, Ser: 1.08 mg/dL (ref 0.61–1.24)
GFR calc Af Amer: >60 mL/min (ref >60)
GFR calc non Af Amer: >60 mL/min (ref >60)
Glucose, Bld: 130 mg/dL — ABNORMAL HIGH (ref 70–99)
Potassium: 3.9 mmol/L (ref 3.5–5.1)
SODIUM: 137 mmol/L (ref 135–145)

## 2019-02-09 LAB — LIPID PANEL
Cholesterol: 175 mg/dL (ref 0–200)
HDL: 41 mg/dL (ref >40)
LDL Cholesterol: 125 mg/dL — ABNORMAL HIGH (ref 0–99)
Total CHOL/HDL Ratio: 4.3 RATIO
Triglycerides: 44 mg/dL (ref <150)
VLDL: 9 mg/dL (ref 0–40)

## 2019-02-09 LAB — TSH: TSH: 0.592 u[IU]/mL (ref 0.350–4.500)

## 2019-02-09 LAB — CBC
HCT: 36.8 % — ABNORMAL LOW (ref 39.0–52.0)
Hemoglobin: 11.5 g/dL — ABNORMAL LOW (ref 13.0–17.0)
MCH: 28.5 pg (ref 26.0–34.0)
MCHC: 31.3 g/dL (ref 30.0–36.0)
MCV: 91.1 fL (ref 80.0–100.0)
Platelets: 150 10*3/uL (ref 150–400)
RBC: 4.04 MIL/uL — ABNORMAL LOW (ref 4.22–5.81)
RDW: 14 % (ref 11.5–15.5)
WBC: 8.6 10*3/uL (ref 4.0–10.5)
nRBC: 0 % (ref 0.0–0.2)

## 2019-02-09 LAB — HEMOGLOBIN A1C
Hgb A1c MFr Bld: 5.5 % (ref 4.8–5.6)
Mean Plasma Glucose: 111.15 mg/dL

## 2019-02-09 LAB — TROPONIN I: Troponin I: <0.03 ng/mL (ref <0.03)

## 2019-02-09 NOTE — Progress Notes (Signed)
PROGRESS NOTE    Ruben Peterson  MWU:[id number] DOB: [birth year] DOA: 02/08/2019 PCP: [name], MD    Brief Narrative: 58 yo w HTN of Hypertension not on any meds, left hip osteoarthritis no Hx CAD, underwent total hip arthroplasty, presented with tachycardia and abnormal EKG post procedure. Medical service and cardiology on board.   Assessment & Plan:   Principal Problem:   Aseptic necrosis of bone of hip, left (HCC) Active Problems:   Osteonecrosis of hip (HCC)   Abnormal ECG   Tachycardia   Aseptic necrosis of the left hip s/p total hip arthroplasty on 02/08/2019. - further management as per orthopedics.    Tachycardia:  EKG shows sinus tachycardia.  Pt remains asymptomatic.  Overnight, echocardiogram ordered for evaluation.  But pt denies any chest pain or sob.     Hypertension:  Resume home meds on discharge.    DVT prophylaxis:xarelto.  Code Status: full code.  Family Communication: family at bedside.   Disposition Plan: peer orthopedics.   Antimicrobials:NOne.   Subjective: Pt reports pain at the site of the surgery.   Objective: Vitals:   02/08/19 2152 02/09/19 0007 02/09/19 0623 02/09/19 0956  BP: 107/80 103/76 110/75 117/80  Pulse: 72 78 75 95  Resp: 17 18 18 16   Temp: 98.8 F (37.1 C) 98 F (36.7 C) 98.3 F (36.8 C) 98.7 F (37.1 C)  TempSrc: Oral Oral Oral Oral  SpO2: 98% 96% 99% 98%  Weight:      Height:        Intake/Output Summary (Last 24 hours) at 02/09/2019 1218 Last data filed at 02/09/2019 1003 Gross per 24 hour  Intake 3145.69 ml  Output 3145 ml  Net 0.69 ml   Filed Weights   02/08/19 0657  Weight: 87.1 kg    Examination:  General exam: Appears calm and comfortable  Respiratory system: Clear to auscultation. Respiratory effort normal. Cardiovascular system: S1 & S2 heard, tachycardic, regular.  No pedal edema. Gastrointestinal system: Abdomen is nondistended, soft and nontender. No organomegaly or masses  felt. Normal bowel sounds heard. Central nervous system: Alert and oriented. No focal neurological deficits. Extremities: left hip surgery site bandaged, with some bleeding in to the bandage. Tenderness on ambulation.  Skin: No rashes, lesions or ulcers Psychiatry:  Mood & affect appropriate.     Data Reviewed: I have personally reviewed following labs and imaging studies  CBC: Recent Labs  Lab 02/03/19 0830 02/08/19 1929 02/09/19 0132  WBC 4.6 7.3 8.6  NEUTROABS  --  6.4  --   HGB 14.5 12.2* 11.5*  HCT 47.7 39.7 36.8*  MCV 92.8 90.6 91.1  PLT 201 185 725   Basic Metabolic Panel: Recent Labs  Lab 02/03/19 0830 02/08/19 1929 02/09/19 0132  NA 139 135 137  K 3.5 3.9 3.9  CL 106 105 106  CO2 27 21* 24  GLUCOSE 87 178* 130*  BUN 13 12 13   CREATININE 0.98 1.13 1.08  CALCIUM 9.1 8.5* 8.2*   GFR: Estimated Creatinine Clearance: 79.5 mL/min (by C-G formula based on SCr of 1.08 mg/dL). Liver Function Tests: Recent Labs  Lab 02/03/19 0830 02/08/19 1929  AST 21 33  ALT 29 27  ALKPHOS 87 71  BILITOT 0.3 0.3  PROT 7.7 7.0  ALBUMIN 3.9 3.4*   No results for input(s): LIPASE, AMYLASE in the last 168 hours. No results for input(s): AMMONIA in the last 168 hours. Coagulation Profile: Recent Labs  Lab 02/03/19 0830  INR 1.03  Cardiac Enzymes: Recent Labs  Lab 02/08/19 1929 02/09/19 0132 02/09/19 0739  TROPONINI <0.03 <0.03 <0.03   BNP (last 3 results) No results for input(s): PROBNP in the last 8760 hours. HbA1C: Recent Labs    02/09/19 0739  HGBA1C 5.5   CBG: No results for input(s): GLUCAP in the last 168 hours. Lipid Profile: Recent Labs    02/09/19 0739  CHOL 175  HDL 41  LDLCALC 125*  TRIG 44  CHOLHDL 4.3   Thyroid Function Tests: Recent Labs    02/09/19 0739  TSH 0.592   Anemia Panel: No results for input(s): VITAMINB12, FOLATE, FERRITIN, TIBC, IRON, RETICCTPCT in the last 72 hours. Sepsis Labs: No results for input(s):  PROCALCITON, LATICACIDVEN in the last 168 hours.  Recent Results (from the past 240 hour(s))  Surgical pcr screen     Status: None   Collection Time: 02/03/19  8:12 AM  Result Value Ref Range Status   MRSA, PCR NEGATIVE NEGATIVE Final   Staphylococcus aureus NEGATIVE NEGATIVE Final    Comment: (NOTE) The Xpert SA Assay (FDA approved for NASAL specimens in patients 59 years of age and older), is one component of a comprehensive surveillance program. It is not intended to diagnose infection nor to guide or monitor treatment. Performed at [facility], [place] [address], [place], [place] [address]          Radiology Studies: Dg Pelvis Portable  Result Date: 02/08/2019 CLINICAL DATA:  Left hip replacement, postoperative assessment EXAM: PORTABLE PELVIS 1-2 VIEWS COMPARISON:  01/19/2018 FINDINGS: A low centered frontal projection of the pelvis includes the entire a left hip prosthesis which appears well positioned an without periprosthetic fracture or immediate complicating feature. A drain is in place along with expected gas in the soft tissues. IMPRESSION: 1. Left total hip prosthesis placement without early complicating feature identified. Electronically Signed   By: Van Clines M.D.   On: 02/08/2019 13:53   Dg Chest Port 1 View  Result Date: 02/08/2019 CLINICAL DATA:  Initial evaluation for acute shortness of breath, hypertension, abnormal ECG. EXAM: PORTABLE CHEST 1 VIEW COMPARISON:  None available. FINDINGS: Cardiac and mediastinal silhouettes are within normal limits. Lungs are normally inflated. Minimal left basilar subsegmental atelectasis. No other focal airspace disease. No edema or effusion. No pneumothorax. No acute osseous finding. IMPRESSION: 1. Mild left basilar subsegmental atelectasis. 2. No other active cardiopulmonary disease. Electronically Signed   By: Jeannine Boga M.D.   On: 02/08/2019 20:09        Scheduled Meds: . acetaminophen   500 mg Oral Q6H  . chlorthalidone  25 mg Oral Daily  . docusate sodium  100 mg Oral BID  . rivaroxaban  10 mg Oral Q breakfast   Continuous Infusions: . sodium chloride 100 mL/hr at 02/09/19 0253  . methocarbamol (ROBAXIN) IV Stopped (02/08/19 1525)     LOS: 1 day    Time spent:31 minutes.     Hosie Poisson, MD [facility] Pager [phone]   If 7PM-7AM, please contact night-coverage [email or web address] Password [email or web address] 02/09/2019, 12:18 PM

## 2019-02-09 NOTE — Progress Notes (Signed)
  Echocardiogram 2D Echocardiogram has been attempted. Canceled per Nurse.  Ruben Peterson 02/09/2019, 2:44 PM
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
## 2019-02-09 NOTE — Consult Note (Addendum)
Cardiology Consultation:   Patient ID: Ruben DUVE Sr. MRN: [id number]; DOB: 1961/04/20  Admit date: 02/08/2019 Date of Consult: 02/09/2019  Primary Care Provider: [name], MD Primary Cardiologist: No primary care provider on file.  Primary Electrophysiologist:  None   Patient Profile:   Ruben TALLO Sr. is a 58 y.o. male with a hx of hypertension, GERD, arthritis who is being seen today for the evaluation of tachycardia at the request of Dr. Roel Cluck.  History of Present Illness:   Ruben Peterson is no prior cardiac history.  He underwent total hip arthroplasty on 02/08/2019.  He subsequently developed transient tachycardia with heart rate up to 110.  EKG showed flipped T waves and nonspecific changes.  The patient had no chest pain or shortness of breath.  His troponins were negative.  Upon my assessment the patient tells me that he has no prior history of cardiac issues.  He was evaluated for epigastric discomfort about 10 years ago but this resolved with burping and he felt was related to GI issues.  He says he did not have a stress test at that time.  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  With his daily activities he has not had any chest pain/pressure/tightness or shortness of breath.  He denies orthopnea, PND, peripheral edema, palpitations.  He currently denies any chest discomfort or shortness of breath.  [name] has hypertension but does not take his antihypertensive daily due to increase in urination.  He says that he checks his blood pressure every day and when it starts to go up he takes his medicine.  The patient has a history of smoking having quit approximately 20 years ago.  He does not drink alcohol currently.  He does not know many specifics about his family history but he does know that his mother got cardiac stents last year at the age of 50.  He knows of no other cardiac history  in his father or 6 siblings.   Past Medical History:  Diagnosis Date  . Arthritis   . Complication of anesthesia    pt. states he remember being intubated at [facility] and complains of  food feeling stuck at times  . GERD (gastroesophageal reflux disease)    diet related  . Hypertension     Past Surgical History:  Procedure Laterality Date  . FRACTURE SURGERY     left  x3  with bone graft and screw  . jaw wired shu from fracture    . toes stretched left foot       Home Medications:  Prior to Admission medications   Medication Sig Start Date End Date Taking? Authorizing Provider  chlorthalidone (HYGROTON) 25 MG tablet Take 25 mg by mouth daily.   Yes [provider]  HYDROcodone-acetaminophen (NORCO/VICODIN) 5-325 MG tablet Take 1 tablet by mouth every 6 (six) hours as needed for moderate pain.   Yes [provider]  traMADol (ULTRAM) 50 MG tablet Take 50 mg by mouth every 6 (six) hours as needed (pain).   Yes [provider]  oxymetazoline (AFRIN) 0.05 % nasal spray Place 1 spray into both nostrils 2 (two) times daily as needed for congestion.    [provider]    Inpatient Medications: Scheduled Meds: . acetaminophen  500 mg Oral Q6H  . chlorthalidone  25 mg Oral Daily  . docusate sodium  100 mg Oral BID  .  rivaroxaban  10 mg Oral Q breakfast   Continuous Infusions: . sodium chloride 100 mL/hr at 02/09/19 0253  . methocarbamol (ROBAXIN) IV Stopped (02/08/19 1525)   PRN Meds: bisacodyl, diphenhydrAMINE, HYDROcodone-acetaminophen, HYDROcodone-acetaminophen, menthol-cetylpyridinium **OR** phenol, methocarbamol **OR** methocarbamol (ROBAXIN) IV, metoCLOPramide **OR** metoCLOPramide (REGLAN) injection, morphine injection, ondansetron **OR** ondansetron (ZOFRAN) IV, polyethylene glycol, sodium phosphate  Allergies:    Allergies  Allergen Reactions  . Benazepril Anaphylaxis  . Heparin Other (See Comments)    Clots blood     Social  History:   Social History   Socioeconomic History  . Marital status: Married    Spouse name: Not on file  . Number of children: Not on file  . Years of education: Not on file  . Highest education level: Not on file  Occupational History  . Not on file  Social Needs  . Financial resource strain: Not on file  . Food insecurity:    Worry: Not on file    Inability: Not on file  . Transportation needs:    Medical: Not on file    Non-medical: Not on file  Tobacco Use  . Smoking status: Former Smoker    Years: 10.00  . Smokeless tobacco: Never Used  . Tobacco comment: quit  25 years   Substance and Sexual Activity  . Alcohol use: Not Currently    Frequency: Never  . Drug use: Never  . Sexual activity: Yes  Lifestyle  . Physical activity:    Days per week: Not on file    Minutes per session: Not on file  . Stress: Not on file  Relationships  . Social connections:    Talks on phone: Not on file    Gets together: Not on file    Attends religious service: Not on file    Active member of club or organization: Not on file    Attends meetings of clubs or organizations: Not on file    Relationship status: Not on file  . Intimate partner violence:    Fear of current or ex partner: Not on file    Emotionally abused: Not on file    Physically abused: Not on file    Forced sexual activity: Not on file  Other Topics Concern  . Not on file  Social History Narrative  . Not on file    Family History:    Family History  Problem Relation Age of Onset  . CAD Mother        Cardiac stent at age 23  . Cancer Neg Hx   . Hypertension Neg Hx      ROS:  Please see the history of present illness.   All other ROS reviewed and negative.     Physical Exam/Data:   Vitals:   02/08/19 2152 02/09/19 0007 02/09/19 0623 02/09/19 0956  BP: 107/80 103/76 110/75 117/80  Pulse: 72 78 75 95  Resp: 17 18 18 16   Temp: 98.8 F (37.1 C) 98 F (36.7 C) 98.3 F (36.8 C) 98.7 F (37.1 C)    TempSrc: Oral Oral Oral Oral  SpO2: 98% 96% 99% 98%  Weight:      Height:        Intake/Output Summary (Last 24 hours) at 02/09/2019 1016 Last data filed at 02/09/2019 0600 Gross per 24 hour  Intake 3115.69 ml  Output 3145 ml  Net -29.31 ml   Last 3 Weights 02/08/2019 02/03/2019  Weight (lbs) 192 lb 192 lb  Weight (kg) 87.091 kg 87.091 kg  Body mass index is 30.07 kg/m.  General:  Well nourished, well developed, in no acute distress HEENT: normal Lymph: no adenopathy Neck: no JVD Endocrine:  No thryomegaly Vascular: No carotid bruits; FA pulses 2+ bilaterally without bruits  Cardiac:  normal S1, S2; RRR; no murmur  Lungs:  clear to auscultation bilaterally, no wheezing, rhonchi or rales  Abd: soft, nontender, no hepatomegaly  Ext: no edema Musculoskeletal:  No deformities, BUE and BLE strength normal and equal.  Surgical bandage left hip. Skin: warm and dry  Neuro:  CNs 2-12 intact, no focal abnormalities noted Psych:  Normal affect   EKG:  The EKG was personally reviewed and demonstrates:  02/03/2019-NSR 62 bpm with early r wave progression 02/08/2019 18:30- Sinus tachycardia, 107 bpm, mild TWI in leads I, II, III, aVF, V4, V5, V6 12/09/19 23:21- Sinus rhythm, 83 bpm, early r wave progression, nonspecific T wave changes inferior-lateral. Telemetry:  Telemetry was personally reviewed and demonstrates:  NSR 70's-80's  Relevant CV Studies:  Echo pending  Laboratory Data:  Chemistry Recent Labs  Lab 02/03/19 0830 02/08/19 1929 02/09/19 0132  NA 139 135 137  K 3.5 3.9 3.9  CL 106 105 106  CO2 27 21* 24  GLUCOSE 87 178* 130*  BUN 13 12 13   CREATININE 0.98 1.13 1.08  CALCIUM 9.1 8.5* 8.2*  GFRNONAA >60 >60 >60  GFRAA >60 >60 >60  ANIONGAP 6 9 7     Recent Labs  Lab 02/03/19 0830 02/08/19 1929  PROT 7.7 7.0  ALBUMIN 3.9 3.4*  AST 21 33  ALT 29 27  ALKPHOS 87 71  BILITOT 0.3 0.3   Hematology Recent Labs  Lab 02/03/19 0830 02/08/19 1929  02/09/19 0132  WBC 4.6 7.3 8.6  RBC 5.14 4.38 4.04*  HGB 14.5 12.2* 11.5*  HCT 47.7 39.7 36.8*  MCV 92.8 90.6 91.1  MCH 28.2 27.9 28.5  MCHC 30.4 30.7 31.3  RDW 13.6 13.8 14.0  PLT 201 185 150   Cardiac Enzymes Recent Labs  Lab 02/08/19 1929 02/09/19 0132 02/09/19 0739  TROPONINI <0.03 <0.03 <0.03   No results for input(s): TROPIPOC in the last 168 hours.  BNPNo results for input(s): BNP, PROBNP in the last 168 hours.  DDimer No results for input(s): DDIMER in the last 168 hours.  Radiology/Studies:  Dg Pelvis Portable  Result Date: 02/08/2019 CLINICAL DATA:  Left hip replacement, postoperative assessment EXAM: PORTABLE PELVIS 1-2 VIEWS COMPARISON:  01/19/2018 FINDINGS: A low centered frontal projection of the pelvis includes the entire a left hip prosthesis which appears well positioned an without periprosthetic fracture or immediate complicating feature. A drain is in place along with expected gas in the soft tissues. IMPRESSION: 1. Left total hip prosthesis placement without early complicating feature identified. Electronically Signed   By: Van Clines M.D.   On: 02/08/2019 13:53   Dg Chest Port 1 View  Result Date: 02/08/2019 CLINICAL DATA:  Initial evaluation for acute shortness of breath, hypertension, abnormal ECG. EXAM: PORTABLE CHEST 1 VIEW COMPARISON:  None available. FINDINGS: Cardiac and mediastinal silhouettes are within normal limits. Lungs are normally inflated. Minimal left basilar subsegmental atelectasis. No other focal airspace disease. No edema or effusion. No pneumothorax. No acute osseous finding. IMPRESSION: 1. Mild left basilar subsegmental atelectasis. 2. No other active cardiopulmonary disease. Electronically Signed   By: Jeannine Boga M.D.   On: 02/08/2019 20:09    Assessment and Plan:   Tachycardia/EKG changes -Patient is status post total hip arthroplasty done yesterday. -Labs are  unremarkable.  Troponins negative. Pt was briefly  tachycardic after surgery, I am unable to see any corresponding telemetry strips.  Initial EKG showed sinus tachycardia at 107 bpm with inferior and lateral T wave inversions, 6 hours later EKG showed sinus rhythm at 83 bpm with only nonspecific T changes inferior and laterally.  The T wave inversions became mostly flat except for very minimal inversion in leads II, 3, aVF -Heart rate is now well controlled in the 70s-80s. -Patient without chest pain or shortness of breath.  He is a very active gentleman with no exertional symptoms at home. -Hospitalist has ordered an echocardiogram. -CVD risk factors include hypertension, remote smoker. -If echocardiogram is normal, would not pursue any further inpatient testing.  Can consider outpatient stress test. -If echocardiogram abnormal will consider further cardiac evaluation.  Hypertension -Treated with chlorthalidone at home.  Patient reports that he takes his medication as needed when his blood pressure goes up.  We discussed better daily maintenance.  Could consider an alternative antihypertensive as patient is bothered by increased urination. -Blood pressure while in the hospital was initially elevated however is well controlled today.  He is on his home medication.  Status post total hip arthroplasty -Postop day 1. -The patient is on Xarelto for DVT prophylaxis.  For questions or updates, please contact [place] Please consult [email or web address] for contact info under   Signed, Daune Perch, NP  02/09/2019 10:16 AM   The patient was seen, examined and discussed with Daune Perch, NP-C and I agree with the above.   58 y.o. male with a hx of hypertension, GERD, arthritis who is being seen today for the evaluation of tachycardia and abnormal ECG post scheduled hip replacement.  Part of preoperative evaluation he had EKG on February 03, 2019 that was completely normal.  He was tachycardic postoperatively and EKG was abnormal with negative T  waves in the inferolateral leads. The patient has no prior cardiac history, other than functional limitation related to his hip pain he is very active and denies any symptoms such as chest pain shortness of breath, palpitation dizziness or syncope.  No lower extremity edema orthopnea proximal nocturnal dyspnea.  I have reviewed his EKG, shows sinus tachycardia and switched limb leads with positive P wave in aVR and negative in aVL and aVF.  Therefore his negative T waves should be ignored, this is confirmed by a follow-up EKG that shows correct limb leads placement and his EKG is again normal with no negative T waves in inferolateral leads.  The patient remains asymptomatic. His physical exam shows no JVDs, regular rate and rhythm, no murmurs, clear lungs bilaterally, good peripheral pulses bilaterally and no edema.  Primary team order an echocardiogram, this is not necessary and the patient can be discharged today.   No further work-up needed, no follow-up is needed for this gentleman.  His labs show elevated LDL cholesterol of 125, without significant family history of premature coronary artery disease and no other risk factors such as smoking, hypertension or diabetes medical management would be recommended, he could also consider starting red yeast rice at the dose of 600 mg daily.  If he is interested he could undergo outpatient calcium scoring testing and based on results we can further optimize his management.  The patient can be discharged home now.  Please call us with any questions.  Ena Dawley, MD 02/09/2019

## 2019-02-09 NOTE — Progress Notes (Signed)
   Subjective: 1 Day Post-Op Procedure(s) (LRB): CONVERSION OF PREVIOUS HIP SURGERY TO TOTAL HIP ARTHROPLASTY (Posterior) (Left) Patient reports pain as mild.   Patient seen in rounds by Dr. Wynelle Link. Patient had issues with tachycardia yesterday, Dr. Lyla Glassing was paged. 12 lead EKG showed sinus tachycardia and cardiac enzymes were WNL. Medicine was consulted and patient was moved to telemetry floor. IV fluids were administered and pain medication was given with improvement in HR. Patient feels well this AM during rounds. Foley catheter to be removed this AM. Denies chest pain or SOB. We will continue therapy today.   Objective: Vital signs in last 24 hours: Temp:  [97.4 F (36.3 C)-98.8 F (37.1 C)] 98.3 F (36.8 C) (02/27 0623) Pulse Rate:  [72-110] 75 (02/27 0623) Resp:  [8-20] 18 (02/27 0623) BP: (103-150)/(75-124) 110/75 (02/27 0623) SpO2:  [96 %-99 %] 99 % (02/27 0623)  Intake/Output from previous day:  Intake/Output Summary (Last 24 hours) at 02/09/2019 0819 Last data filed at 02/09/2019 0600 Gross per 24 hour  Intake 4115.69 ml  Output 3695 ml  Net 420.69 ml    Labs: Recent Labs    02/08/19 1929 02/09/19 0132  HGB 12.2* 11.5*   Recent Labs    02/08/19 1929 02/09/19 0132  WBC 7.3 8.6  RBC 4.38 4.04*  HCT 39.7 36.8*  PLT 185 150   Recent Labs    02/08/19 1929 02/09/19 0132  NA 135 137  K 3.9 3.9  CL 105 106  CO2 21* 24  BUN 12 13  CREATININE 1.13 1.08  GLUCOSE 178* 130*  CALCIUM 8.5* 8.2*   Exam: General - Patient is Alert and Oriented Extremity - Neurologically intact Neurovascular intact Sensation intact distally Dorsiflexion/Plantar flexion intact Dressing - dressing C/D/I Motor Function - intact, moving foot and toes well on exam.   Past Medical History:  Diagnosis Date  . Arthritis   . Complication of anesthesia    pt. states he remember being intubated at [facility] and complains of  food feeling stuck at times  . GERD (gastroesophageal  reflux disease)    diet related  . Hypertension     Assessment/Plan: 1 Day Post-Op Procedure(s) (LRB): CONVERSION OF PREVIOUS HIP SURGERY TO TOTAL HIP ARTHROPLASTY (Posterior) (Left) Principal Problem:   Aseptic necrosis of bone of hip, left (HCC) Active Problems:   Osteonecrosis of hip (HCC)   Abnormal ECG   Tachycardia  Estimated body mass index is 30.07 kg/m as calculated from the following:   Height as of this encounter: 5\' 7"  (1.702 m).   Weight as of this encounter: 87.1 kg. Advance diet Up with therapy  DVT Prophylaxis - Xarelto Weight bearing as tolerated D/C knee immobilizer Hemovac pulled without difficulty Begin therapy Hip precautions discussed with patient  Plan is to go Home after hospital stay. Plan for discharge tomorrow if meeting goals with therapy and medically cleared.   Theresa Duty, PA-C Orthopedic Surgery 02/09/2019, 8:19 AM

## 2019-02-09 NOTE — Plan of Care (Signed)
Plan of care reviewed and discussed with the patient and his family.

## 2019-02-10 LAB — BASIC METABOLIC PANEL
Anion gap: 7 (ref 5–15)
BUN: 14 mg/dL (ref 6–20)
CO2: 24 mmol/L (ref 22–32)
Calcium: 8.4 mg/dL — ABNORMAL LOW (ref 8.9–10.3)
Chloride: 106 mmol/L (ref 98–111)
Creatinine, Ser: 0.94 mg/dL (ref 0.61–1.24)
GFR calc Af Amer: >60 mL/min (ref >60)
GFR calc non Af Amer: >60 mL/min (ref >60)
Glucose, Bld: 131 mg/dL — ABNORMAL HIGH (ref 70–99)
Potassium: 4.1 mmol/L (ref 3.5–5.1)
SODIUM: 137 mmol/L (ref 135–145)

## 2019-02-10 LAB — CBC
HCT: 36.1 % — ABNORMAL LOW (ref 39.0–52.0)
HEMOGLOBIN: 11 g/dL — AB (ref 13.0–17.0)
MCH: 28.1 pg (ref 26.0–34.0)
MCHC: 30.5 g/dL (ref 30.0–36.0)
MCV: 92.1 fL (ref 80.0–100.0)
Platelets: 166 10*3/uL (ref 150–400)
RBC: 3.92 MIL/uL — ABNORMAL LOW (ref 4.22–5.81)
RDW: 14.4 % (ref 11.5–15.5)
WBC: 12.7 10*3/uL — ABNORMAL HIGH (ref 4.0–10.5)
nRBC: 0 % (ref 0.0–0.2)

## 2019-02-10 MED ORDER — HYDROCODONE-ACETAMINOPHEN 7.5-325 MG PO TABS: 1.0000 | ORAL_TABLET | Freq: Four times a day (QID) | ORAL | 0 refills | Status: AC | PRN

## 2019-02-10 MED ORDER — RIVAROXABAN 10 MG PO TABS: 10.0000 mg | ORAL_TABLET | Freq: Every day | ORAL | 0 refills | Status: AC

## 2019-02-10 MED ORDER — METHOCARBAMOL 500 MG PO TABS: 500.0000 mg | ORAL_TABLET | Freq: Four times a day (QID) | ORAL | 0 refills | Status: AC | PRN

## 2019-02-10 NOTE — Progress Notes (Signed)
   Subjective: 2 Days Post-Op Procedure(s) (LRB): CONVERSION OF PREVIOUS HIP SURGERY TO TOTAL HIP ARTHROPLASTY (Posterior) (Left) Patient reports pain as moderate.   Patient seen in rounds by Dr. Wynelle Link. Patient is well, and has had no acute complaints or problems other than pain in the left hip. HR is controlled in the 70s and patient is asymptomatic at this time. Denies chest pain or SOB. Voiding without difficulty and positive flatus. Echocardiogram cancelled.  Plan is to go Home after hospital stay.  Objective: Vital signs in last 24 hours: Temp:  [97.6 F (36.4 C)-98.7 F (37.1 C)] 97.6 F (36.4 C) (02/28 0500) Pulse Rate:  [70-95] 70 (02/28 0500) Resp:  [14-20] 20 (02/28 0500) BP: (111-122)/(77-83) 120/77 (02/28 0500) SpO2:  [96 %-98 %] 96 % (02/28 0500)  Intake/Output from previous day:  Intake/Output Summary (Last 24 hours) at 02/10/2019 0701 Last data filed at 02/09/2019 1800 Gross per 24 hour  Intake 1674.01 ml  Output -  Net 1674.01 ml    Labs: Recent Labs    02/08/19 1929 02/09/19 0132 02/10/19 0400  HGB 12.2* 11.5* 11.0*   Recent Labs    02/09/19 0132 02/10/19 0400  WBC 8.6 12.7*  RBC 4.04* 3.92*  HCT 36.8* 36.1*  PLT 150 166   Recent Labs    02/09/19 0132 02/10/19 0400  NA 137 137  K 3.9 4.1  CL 106 106  CO2 24 24  BUN 13 14  CREATININE 1.08 0.94  GLUCOSE 130* 131*  CALCIUM 8.2* 8.4*   Exam: General - Patient is Alert and Oriented Extremity - Neurologically intact Neurovascular intact Sensation intact distally Dorsiflexion/Plantar flexion intact Dressing/Incision - clean, dry, no drainage Motor Function - intact, moving foot and toes well on exam.   Past Medical History:  Diagnosis Date  . Arthritis   . Complication of anesthesia    pt. states he remember being intubated at [facility] and complains of  food feeling stuck at times  . GERD (gastroesophageal reflux disease)    diet related  . Hypertension     Assessment/Plan: 2  Days Post-Op Procedure(s) (LRB): CONVERSION OF PREVIOUS HIP SURGERY TO TOTAL HIP ARTHROPLASTY (Posterior) (Left) Principal Problem:   Aseptic necrosis of bone of hip, left (HCC) Active Problems:   Osteonecrosis of hip (HCC)   Abnormal ECG   Tachycardia  Estimated body mass index is 30.07 kg/m as calculated from the following:   Height as of this encounter: 5\' 7"  (1.702 m).   Weight as of this encounter: 87.1 kg. Up with therapy D/C IV fluids  DVT Prophylaxis - Xarelto Weight-bearing as tolerated  Plan for discharge to home today once meeting goals with physical therapy with HE[facility]. Follow-up in the office in 2 weeks.   Theresa Duty, PA-C Orthopedic Surgery 02/10/2019, 7:01 AM
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
## 2019-02-13 ENCOUNTER — Encounter (HOSPITAL_COMMUNITY): Payer: Self-pay | Admitting: Orthopedic Surgery

## 2019-02-13 NOTE — Discharge Summary (Signed)
Physician Discharge Summary   Patient ID: Ruben MESCHKE Sr. MRN: [id number] DOB/AGE: 12/27/1960 58 y.o.  Admit date: 02/08/2019 Discharge date: 02/10/2019  Primary Diagnosis: Osteonecrosis, left hip   Admission Diagnoses:  Past Medical History:  Diagnosis Date  . Arthritis   . Complication of anesthesia    pt. states he remember being intubated at [facility] and complains of  food feeling stuck at times  . GERD (gastroesophageal reflux disease)    diet related  . Hypertension    Discharge Diagnoses:   Principal Problem:   Aseptic necrosis of bone of hip, left (HCC) Active Problems:   Osteonecrosis of hip (HCC)   Abnormal ECG   Tachycardia  Estimated body mass index is 30.07 kg/m as calculated from the following:   Height as of this encounter: 5\' 7"  (1.702 m).   Weight as of this encounter: 87.1 kg.  Procedure:  Procedure(s) (LRB): CONVERSION OF PREVIOUS HIP SURGERY TO TOTAL HIP ARTHROPLASTY (Posterior) (Left)   Consults: Hospitalists and Cardiologist  HPI: Ruben Riches. is a 58 y.o. 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 Pain occurs with activity and rest including pain at night. He has tried analgesics, protected weight bearing and rest without benefit. Pain is too severe to attempt physical therapy. Radiographs demonstrate bone on bone arthritis with subchondral cyst formation. He presents now for left THA.  Laboratory Data: Admission on 02/08/2019, Discharged on 02/10/2019  Component Date Value Ref Range Status  . WBC 02/09/2019 8.6  4.0 - 10.5 K/uL Final  . RBC 02/09/2019 4.04* 4.22 - 5.81 MIL/uL Final  . Hemoglobin 02/09/2019 11.5* 13.0 - 17.0 g/dL Final  . HCT 02/09/2019 36.8* 39.0 - 52.0 % Final  . MCV 02/09/2019 91.1  80.0 - 100.0 fL Final  . MCH 02/09/2019 28.5  26.0 - 34.0 pg Final  . MCHC 02/09/2019 31.3  30.0 - 36.0 g/dL Final  . RDW 02/09/2019 14.0  11.5 - 15.5 % Final  . Platelets  02/09/2019 150  150 - 400 K/uL Final  . nRBC 02/09/2019 0.0  0.0 - 0.2 % Final   Performed at [facility], [place] [address], [place], [place] [address]  . Sodium 02/09/2019 137  135 - 145 mmol/L Final  . Potassium 02/09/2019 3.9  3.5 - 5.1 mmol/L Final  . Chloride 02/09/2019 106  98 - 111 mmol/L Final  . CO2 02/09/2019 24  22 - 32 mmol/L Final  . Glucose, Bld 02/09/2019 130* 70 - 99 mg/dL Final  . BUN 02/09/2019 13  6 - 20 mg/dL Final  . Creatinine, Ser 02/09/2019 1.08  0.61 - 1.24 mg/dL Final  . Calcium 02/09/2019 8.2* 8.9 - 10.3 mg/dL Final  . GFR calc non Af Amer 02/09/2019 >60  >60 mL/min Final  . GFR calc Af Amer 02/09/2019 >60  >60 mL/min Final  . Anion gap 02/09/2019 7  5 - 15 Final   Performed at [facility], [place] [address], [place], [place] [address]  . Troponin I 02/08/2019 <0.03  <0.03 ng/mL Final   Performed at [facility], [place] [address], [place], [place] [address]  . Troponin I 02/09/2019 <0.03  <0.03 ng/mL Final   Performed at [facility], [place] [address], [place], [place] [address]  . Troponin I 02/09/2019 <0.03  <0.03 ng/mL Final   Performed at [facility], [place] [address], [place], [place] [address]  . WBC 02/08/2019  7.3  4.0 - 10.5 K/uL Final  . RBC 02/08/2019 4.38  4.22 - 5.81 MIL/uL Final  . Hemoglobin 02/08/2019 12.2* 13.0 - 17.0 g/dL Final  . HCT 02/08/2019 39.7  39.0 - 52.0 % Final  . MCV 02/08/2019 90.6  80.0 - 100.0 fL Final  . MCH 02/08/2019 27.9  26.0 - 34.0 pg Final  . MCHC 02/08/2019 30.7  30.0 - 36.0 g/dL Final  . RDW 02/08/2019 13.8  11.5 - 15.5 % Final  . Platelets 02/08/2019 185  150 - 400 K/uL Final  . nRBC 02/08/2019 0.0  0.0 - 0.2 % Final  . Neutrophils Relative % 02/08/2019 89  % Final  . Neutro Abs 02/08/2019 6.4  1.7 - 7.7 K/uL Final  . Lymphocytes Relative 02/08/2019 9  % Final  . Lymphs Abs 02/08/2019 0.7  0.7 - 4.0 K/uL Final  . Monocytes  Relative 02/08/2019 2  % Final  . Monocytes Absolute 02/08/2019 0.2  0.1 - 1.0 K/uL Final  . Eosinophils Relative 02/08/2019 0  % Final  . Eosinophils Absolute 02/08/2019 0.0  0.0 - 0.5 K/uL Final  . Basophils Relative 02/08/2019 0  % Final  . Basophils Absolute 02/08/2019 0.0  0.0 - 0.1 K/uL Final  . Immature Granulocytes 02/08/2019 0  % Final  . Abs Immature Granulocytes 02/08/2019 0.03  0.00 - 0.07 K/uL Final   Performed at [facility], [place] [address], [place], [place] [address]  . Sodium 02/08/2019 135  135 - 145 mmol/L Final  . Potassium 02/08/2019 3.9  3.5 - 5.1 mmol/L Final  . Chloride 02/08/2019 105  98 - 111 mmol/L Final  . CO2 02/08/2019 21* 22 - 32 mmol/L Final  . Glucose, Bld 02/08/2019 178* 70 - 99 mg/dL Final  . BUN 02/08/2019 12  6 - 20 mg/dL Final  . Creatinine, Ser 02/08/2019 1.13  0.61 - 1.24 mg/dL Final  . Calcium 02/08/2019 8.5* 8.9 - 10.3 mg/dL Final  . Total Protein 02/08/2019 7.0  6.5 - 8.1 g/dL Final  . Albumin 02/08/2019 3.4* 3.5 - 5.0 g/dL Final  . AST 02/08/2019 33  15 - 41 U/L Final  . ALT 02/08/2019 27  0 - 44 U/L Final  . Alkaline Phosphatase 02/08/2019 71  38 - 126 U/L Final  . Total Bilirubin 02/08/2019 0.3  0.3 - 1.2 mg/dL Final  . GFR calc non Af Amer 02/08/2019 >60  >60 mL/min Final  . GFR calc Af Amer 02/08/2019 >60  >60 mL/min Final  . Anion gap 02/08/2019 9  5 - 15 Final   Performed at [facility], [place] [address] [place], [place] [address]  . Hgb A1c MFr Bld 02/09/2019 5.5  4.8 - 5.6 % Final   Comment: (NOTE) Pre diabetes:          5.7%-6.4% Diabetes:              >6.4% Glycemic control for   <7.0% adults with diabetes   . Mean Plasma Glucose 02/09/2019 111.15  mg/dL Final   Performed at [place] [address] [place], [place] [address]  . Cholesterol 02/09/2019 175  0 - 200 mg/dL Final  . Triglycerides 02/09/2019 44  <150 mg/dL Final  . HDL 02/09/2019 41  >40 mg/dL Final  . Total  CHOL/HDL Ratio 02/09/2019 4.3  RATIO Final  . VLDL 02/09/2019 9  0 - 40 mg/dL Final  . LDL Cholesterol 02/09/2019 125* 0 - 99 mg/dL Final   Comment:  Total Cholesterol/HDL:CHD Risk Coronary Heart Disease Risk Table                     Men   Women  1/2 Average Risk   3.4   3.3  Average Risk       5.0   4.4  2 X Average Risk   9.6   7.1  3 X Average Risk  23.4   11.0        Use the calculated Patient Ratio above and the CHD Risk Table to determine the patient's CHD Risk.        ATP III CLASSIFICATION (LDL):  <100     mg/dL   Optimal  100-129  mg/dL   Near or Above                    Optimal  130-159  mg/dL   Borderline  160-189  mg/dL   High  >190     mg/dL   Very High Performed at [place] [address], [place], [place] [address]   . TSH 02/09/2019 0.592  0.350 - 4.500 uIU/mL Final   Comment: Performed by a 3rd Generation assay with a functional sensitivity of <=0.01 uIU/mL. Performed at [facility], [place] [address] [place], [place] [address]   . WBC 02/10/2019 12.7* 4.0 - 10.5 K/uL Final  . RBC 02/10/2019 3.92* 4.22 - 5.81 MIL/uL Final  . Hemoglobin 02/10/2019 11.0* 13.0 - 17.0 g/dL Final  . HCT 02/10/2019 36.1* 39.0 - 52.0 % Final  . MCV 02/10/2019 92.1  80.0 - 100.0 fL Final  . MCH 02/10/2019 28.1  26.0 - 34.0 pg Final  . MCHC 02/10/2019 30.5  30.0 - 36.0 g/dL Final  . RDW 02/10/2019 14.4  11.5 - 15.5 % Final  . Platelets 02/10/2019 166  150 - 400 K/uL Final  . nRBC 02/10/2019 0.0  0.0 - 0.2 % Final   Performed at [facility], [place] [address], [place], [place] [address]  . Sodium 02/10/2019 137  135 - 145 mmol/L Final  . Potassium 02/10/2019 4.1  3.5 - 5.1 mmol/L Final  . Chloride 02/10/2019 106  98 - 111 mmol/L Final  . CO2 02/10/2019 24  22 - 32 mmol/L Final  . Glucose, Bld 02/10/2019 131* 70 - 99 mg/dL Final  . BUN 02/10/2019 14  6 - 20 mg/dL Final  . Creatinine, Ser 02/10/2019 0.94  0.61 - 1.24  mg/dL Final  . Calcium 02/10/2019 8.4* 8.9 - 10.3 mg/dL Final  . GFR calc non Af Amer 02/10/2019 >60  >60 mL/min Final  . GFR calc Af Amer 02/10/2019 >60  >60 mL/min Final  . Anion gap 02/10/2019 7  5 - 15 Final   Performed at [facility], [place] [address], [place], [place] [address]  Hospital Outpatient Visit on 02/03/2019  Component Date Value Ref Range Status  . aPTT 02/03/2019 29  24 - 36 seconds Final   Performed at [facility], [place] [address], [place], [place] [address]  . WBC 02/03/2019 4.6  4.0 - 10.5 K/uL Final  . RBC 02/03/2019 5.14  4.22 - 5.81 MIL/uL Final  . Hemoglobin 02/03/2019 14.5  13.0 - 17.0 g/dL Final  . HCT 02/03/2019 47.7  39.0 - 52.0 % Final  . MCV 02/03/2019 92.8  80.0 - 100.0 fL Final  . MCH 02/03/2019 28.2  26.0 - 34.0 pg Final  . MCHC 02/03/2019  30.4  30.0 - 36.0 g/dL Final  . RDW 02/03/2019 13.6  11.5 - 15.5 % Final  . Platelets 02/03/2019 201  150 - 400 K/uL Final  . nRBC 02/03/2019 0.0  0.0 - 0.2 % Final   Performed at [facility], [place] [address] [place], [place] [address]  . Sodium 02/03/2019 139  135 - 145 mmol/L Final  . Potassium 02/03/2019 3.5  3.5 - 5.1 mmol/L Final  . Chloride 02/03/2019 106  98 - 111 mmol/L Final  . CO2 02/03/2019 27  22 - 32 mmol/L Final  . Glucose, Bld 02/03/2019 87  70 - 99 mg/dL Final  . BUN 02/03/2019 13  6 - 20 mg/dL Final  . Creatinine, Ser 02/03/2019 0.98  0.61 - 1.24 mg/dL Final  . Calcium 02/03/2019 9.1  8.9 - 10.3 mg/dL Final  . Total Protein 02/03/2019 7.7  6.5 - 8.1 g/dL Final  . Albumin 02/03/2019 3.9  3.5 - 5.0 g/dL Final  . AST 02/03/2019 21  15 - 41 U/L Final  . ALT 02/03/2019 29  0 - 44 U/L Final  . Alkaline Phosphatase 02/03/2019 87  38 - 126 U/L Final  . Total Bilirubin 02/03/2019 0.3  0.3 - 1.2 mg/dL Final  . GFR calc non Af Amer 02/03/2019 >60  >60 mL/min Final  . GFR calc Af Amer 02/03/2019 >60  >60 mL/min Final  . Anion gap 02/03/2019 6  5 - 15  Final   Performed at [facility], [place] [address] [place], [place] [address]  . Prothrombin Time 02/03/2019 13.4  11.4 - 15.2 seconds Final  . INR 02/03/2019 1.03   Final   Performed at [place] [address], [place], [place] [address]  . ABO/RH(D) 02/03/2019 A POS   Final  . Antibody Screen 02/03/2019 NEG   Final  . Sample Expiration 02/03/2019 02/11/2019   Final  . Extend sample reason 02/03/2019    Final                   Value:NO TRANSFUSIONS OR PREGNANCY IN THE PAST 3 MONTHS Performed at [place] [address], [place], [place] [address]   . Color, Urine 02/03/2019 YELLOW  YELLOW Final  . APPearance 02/03/2019 CLEAR  CLEAR Final  . Specific Gravity, Urine 02/03/2019 1.023  1.005 - 1.030 Final  . pH 02/03/2019 6.0  5.0 - 8.0 Final  . Glucose, UA 02/03/2019 NEGATIVE  NEGATIVE mg/dL Final  . Hgb urine dipstick 02/03/2019 NEGATIVE  NEGATIVE Final  . Bilirubin Urine 02/03/2019 NEGATIVE  NEGATIVE Final  . Ketones, ur 02/03/2019 NEGATIVE  NEGATIVE mg/dL Final  . Protein, ur 02/03/2019 NEGATIVE  NEGATIVE mg/dL Final  . Nitrite 02/03/2019 NEGATIVE  NEGATIVE Final  . Chalmers Guest 02/03/2019 NEGATIVE  NEGATIVE Final   Performed at [facility], [place] [address], [place], [place] [address]  . MRSA, PCR 02/03/2019 NEGATIVE  NEGATIVE Final  . Staphylococcus aureus 02/03/2019 NEGATIVE  NEGATIVE Final   Comment: (NOTE) The Xpert SA Assay (FDA approved for NASAL specimens in patients 12 years of age and older), is one component of a comprehensive surveillance program. It is not intended to diagnose infection nor to guide or monitor treatment. Performed at [facility], [place] [address], [place], [place] [address]   . ABO/RH(D) 02/03/2019    Final                   Value:A POS  Performed at [facility], [place] [address], [place], [place] [address]      X-Rays:Dg Pelvis  Portable  Result Date: 02/08/2019 CLINICAL DATA:  Left hip replacement, postoperative assessment EXAM: PORTABLE PELVIS 1-2 VIEWS COMPARISON:  01/19/2018 FINDINGS: A low centered frontal projection of the pelvis includes the entire a left hip prosthesis which appears well positioned an without periprosthetic fracture or immediate complicating feature. A drain is in place along with expected gas in the soft tissues. IMPRESSION: 1. Left total hip prosthesis placement without early complicating feature identified. Electronically Signed   By: Van Clines M.D.   On: 02/08/2019 13:53   Dg Chest Port 1 View  Result Date: 02/08/2019 CLINICAL DATA:  Initial evaluation for acute shortness of breath, hypertension, abnormal ECG. EXAM: PORTABLE CHEST 1 VIEW COMPARISON:  None available. FINDINGS: Cardiac and mediastinal silhouettes are within normal limits. Lungs are normally inflated. Minimal left basilar subsegmental atelectasis. No other focal airspace disease. No edema or effusion. No pneumothorax. No acute osseous finding. IMPRESSION: 1. Mild left basilar subsegmental atelectasis. 2. No other active cardiopulmonary disease. Electronically Signed   By: Jeannine Boga M.D.   On: 02/08/2019 20:09    EKG: Orders placed or performed during the hospital encounter of 02/08/19  . EKG 12-Lead  . EKG 12-Lead  . EKG 12-Lead  . EKG 12-Lead     Hospital Course: Ruben METOYER Sr. is a 58 y.o. who was admitted to [facility]. They were brought to the operating room on 02/08/2019 and underwent Procedure(s): CONVERSION OF PREVIOUS HIP SURGERY TO TOTAL HIP ARTHROPLASTY (Posterior).  Patient tolerated the procedure well and was later transferred to the recovery room and then to the orthopaedic floor for postoperative care. They were given PO and IV analgesics for pain control following their surgery. They were given 24 hours of postoperative antibiotics of  Anti-infectives (From admission, onward)    Start     Dose/Rate Route Frequency Ordered Stop   02/08/19 1500  ceFAZolin (ANCEF) IVPB 2g/100 mL premix     2 g 200 mL/hr over 30 Minutes Intravenous Every 6 hours 02/08/19 1240 02/09/19 0045   02/08/19 0645  ceFAZolin (ANCEF) IVPB 2g/100 mL premix     2 g 200 mL/hr over 30 Minutes Intravenous On call to O.R. 02/08/19 8676 02/08/19 0830     and started on DVT prophylaxis in the form of Xarelto.   PT and OT were ordered for total joint protocol. Discharge planning consulted to help with postop disposition and equipment needs. Patient had issues with tachycardia on the night of surgery. Dr. Lyla Glassing was paged, and a 12-lead EKG showing sinus tachycardia and cardiac enzymes that were WNL were ordered. Medicine was consulted and patient was moved to telemetry floor. IV fluids were administered and pain medication was given with improvement in HR. Heart rate had improved and patient was feeling well on POD #1 during AM rounds. They started to get up OOB with therapy on POD #0. Hemovac drain was pulled without difficulty on day one and knee immobilizer was discontinued. Continued to work with therapy into POD #2. Pt was seen during rounds on day two and was ready to go home pending progress with therapy. Heart rate continued to be stable in the 70s and patient was asymptomatic. Tachycardia was believed to be due to dehydration and pain by both medicine and cardiology with no further workup warranted. Dressing was changed and the incision was clean, dry, and intact with no drainage.  Pt worked with therapy for one additional session and was meeting their goals. He was discharged to home later that day in stable condition.  Diet: Regular diet Activity: WBAT. Hip precautions discussed with patient. Follow-up: in 2 weeks Disposition: Home with HEP Discharged Condition: stable   Discharge Instructions    Call MD / Call 911   Complete by:  As directed    If you experience chest pain or shortness of breath,  CALL 911 and be transported to the hospital emergency room.  If you develope a fever above 101 F, pus (white drainage) or increased drainage or redness at the wound, or calf pain, call your surgeon's office.   Constipation Prevention   Complete by:  As directed    Drink plenty of fluids.  Prune juice may be helpful.  You may use a stool softener, such as Colace (over the counter) 100 mg twice a day.  Use MiraLax (over the counter) for constipation as needed.   Diet - low sodium heart healthy   Complete by:  As directed    Driving restrictions   Complete by:  As directed    No driving for 2 weeks   Weight bearing as tolerated   Complete by:  As directed      Allergies as of 02/10/2019      Reactions   Benazepril Anaphylaxis   Heparin Other (See Comments)   Clots blood       Medication List    STOP taking these medications   HYDROcodone-acetaminophen 5-325 MG tablet Commonly known as:  NORCO/VICODIN Replaced by:  HYDROcodone-acetaminophen 7.5-325 MG tablet     TAKE these medications   chlorthalidone 25 MG tablet Commonly known as:  HYGROTON Take 25 mg by mouth daily.   HYDROcodone-acetaminophen 7.5-325 MG tablet Commonly known as:  NORCO Take 1-2 tablets by mouth every 6 (six) hours as needed for severe pain (pain score 7-10). Replaces:  HYDROcodone-acetaminophen 5-325 MG tablet   methocarbamol 500 MG tablet Commonly known as:  ROBAXIN Take 1 tablet (500 mg total) by mouth every 6 (six) hours as needed for muscle spasms.   oxymetazoline 0.05 % nasal spray Commonly known as:  AFRIN Place 1 spray into both nostrils 2 (two) times daily as needed for congestion.   rivaroxaban 10 MG Tabs tablet Commonly known as:  XARELTO Take 1 tablet (10 mg total) by mouth daily with breakfast for 19 days. Then take one 81 mg aspirin once a day for three weeks. Then discontinue aspirin.   traMADol 50 MG tablet Commonly known as:  ULTRAM Take 50 mg by mouth every 6 (six) hours as needed  (pain).            Discharge Care Instructions  (From admission, onward)         Start     Ordered   02/09/19 0000  Weight bearing as tolerated     02/09/19 2505         Follow-up Information    Gaynelle Arabian, MD. Schedule an appointment as soon as possible for a visit on 02/23/2019.   Specialty:  Orthopedic Surgery Contact information: [address] [place] [address] [phone]           Signed: Theresa Duty, PA-C Orthopedic Surgery 02/13/2019, 7:39 AM

## 2019-02-18 ENCOUNTER — Emergency Department (HOSPITAL_COMMUNITY)
Admission: EM | Admit: 2019-02-18 | Discharge: 2019-02-18 | Disposition: A | Discharge status: Home / Self Care | Payer: PRIVATE HEALTH INSURANCE | Attending: Emergency Medicine | Admitting: Emergency Medicine

## 2019-02-18 ENCOUNTER — Emergency Department (HOSPITAL_COMMUNITY): Payer: PRIVATE HEALTH INSURANCE

## 2019-02-18 ENCOUNTER — Other Ambulatory Visit: Payer: Self-pay

## 2019-02-18 ENCOUNTER — Encounter (HOSPITAL_COMMUNITY): Payer: Self-pay

## 2019-02-18 DIAGNOSIS — R531 Weakness: Secondary | ICD-10-CM | POA: Diagnosis present

## 2019-02-18 DIAGNOSIS — Z7982 Long term (current) use of aspirin: Secondary | ICD-10-CM | POA: Insufficient documentation

## 2019-02-18 DIAGNOSIS — R195 Other fecal abnormalities: Secondary | ICD-10-CM

## 2019-02-18 DIAGNOSIS — D1803 Hemangioma of intra-abdominal structures: Secondary | ICD-10-CM | POA: Insufficient documentation

## 2019-02-18 DIAGNOSIS — R1013 Epigastric pain: Secondary | ICD-10-CM | POA: Insufficient documentation

## 2019-02-18 DIAGNOSIS — I10 Essential (primary) hypertension: Secondary | ICD-10-CM | POA: Insufficient documentation

## 2019-02-18 DIAGNOSIS — Z87891 Personal history of nicotine dependence: Secondary | ICD-10-CM | POA: Insufficient documentation

## 2019-02-18 DIAGNOSIS — Z79899 Other long term (current) drug therapy: Secondary | ICD-10-CM | POA: Insufficient documentation

## 2019-02-18 DIAGNOSIS — Z96642 Presence of left artificial hip joint: Secondary | ICD-10-CM | POA: Diagnosis not present

## 2019-02-18 LAB — CBC
HEMATOCRIT: 40.3 % (ref 39.0–52.0)
Hemoglobin: 12.9 g/dL — ABNORMAL LOW (ref 13.0–17.0)
MCH: 28.3 pg (ref 26.0–34.0)
MCHC: 32 g/dL (ref 30.0–36.0)
MCV: 88.4 fL (ref 80.0–100.0)
Platelets: 391 10*3/uL (ref 150–400)
RBC: 4.56 MIL/uL (ref 4.22–5.81)
RDW: 13.4 % (ref 11.5–15.5)
WBC: 9.4 10*3/uL (ref 4.0–10.5)
nRBC: 0 % (ref 0.0–0.2)

## 2019-02-18 LAB — COMPREHENSIVE METABOLIC PANEL
ALT: 30 U/L (ref 0–44)
AST: 22 U/L (ref 15–41)
Albumin: 3.9 g/dL (ref 3.5–5.0)
Alkaline Phosphatase: 88 U/L (ref 38–126)
Anion gap: 12 (ref 5–15)
BUN: 20 mg/dL (ref 6–20)
CHLORIDE: 93 mmol/L — AB (ref 98–111)
CO2: 27 mmol/L (ref 22–32)
CREATININE: 1.2 mg/dL (ref 0.61–1.24)
Calcium: 9.4 mg/dL (ref 8.9–10.3)
GFR calc Af Amer: >60 mL/min (ref >60)
GFR calc non Af Amer: >60 mL/min (ref >60)
Glucose, Bld: 107 mg/dL — ABNORMAL HIGH (ref 70–99)
Potassium: 3.4 mmol/L — ABNORMAL LOW (ref 3.5–5.1)
Sodium: 132 mmol/L — ABNORMAL LOW (ref 135–145)
Total Bilirubin: 0.9 mg/dL (ref 0.3–1.2)
Total Protein: 8.4 g/dL — ABNORMAL HIGH (ref 6.5–8.1)

## 2019-02-18 LAB — POC OCCULT BLOOD, ED: Fecal Occult Bld: POSITIVE — AB

## 2019-02-18 LAB — APTT: aPTT: 26 seconds (ref 24–36)

## 2019-02-18 LAB — PROTIME-INR
INR: 1.1 (ref 0.8–1.2)
Prothrombin Time: 13.7 seconds (ref 11.4–15.2)

## 2019-02-18 LAB — LIPASE, BLOOD: Lipase: 64 U/L — ABNORMAL HIGH (ref 11–51)

## 2019-02-18 LAB — TYPE AND SCREEN
ABO/RH(D): A POS
Antibody Screen: NEGATIVE

## 2019-02-18 LAB — URINALYSIS, ROUTINE W REFLEX MICROSCOPIC
Bilirubin Urine: NEGATIVE
Glucose, UA: NEGATIVE mg/dL
Hgb urine dipstick: NEGATIVE
Ketones, ur: 5 mg/dL — AB
Leukocytes,Ua: NEGATIVE
Nitrite: NEGATIVE
Protein, ur: NEGATIVE mg/dL
Specific Gravity, Urine: 1.02 (ref 1.005–1.030)
pH: 9 — ABNORMAL HIGH (ref 5.0–8.0)

## 2019-02-18 LAB — TROPONIN I: Troponin I: <0.03 ng/mL (ref <0.03)

## 2019-02-18 MED ORDER — ONDANSETRON 4 MG PO TBDP: 4.0000 mg | ORAL_TABLET | Freq: Three times a day (TID) | ORAL | 0 refills | Status: AC | PRN

## 2019-02-18 MED ORDER — ACETAMINOPHEN 650 MG RE SUPP: 650.0000 mg | Freq: Once | RECTAL | Status: DC

## 2019-02-18 MED ORDER — IOPAMIDOL (ISOVUE-300) INJECTION 61%
100.0000 mL | Freq: Once | INTRAVENOUS | Status: AC | PRN
  Administered 2019-02-18: 100 mL via INTRAVENOUS

## 2019-02-18 MED ORDER — PANTOPRAZOLE SODIUM 40 MG PO TBEC: 40.0000 mg | DELAYED_RELEASE_TABLET | Freq: Every day | ORAL | 0 refills | Status: AC

## 2019-02-18 MED ORDER — SODIUM CHLORIDE 0.9 % IV SOLN
INTRAVENOUS | Status: DC
  Administered 2019-02-18: 16:00:00 via INTRAVENOUS

## 2019-02-18 MED ORDER — SUCRALFATE 1 G PO TABS: 1.0000 g | ORAL_TABLET | Freq: Three times a day (TID) | ORAL | 0 refills | Status: AC

## 2019-02-18 MED ORDER — IOPAMIDOL (ISOVUE-300) INJECTION 61%
INTRAVENOUS | Status: AC
  Administered 2019-02-18: 17:00:00
  Filled 2019-02-18: qty 100

## 2019-02-18 MED ORDER — SODIUM CHLORIDE (PF) 0.9 % IJ SOLN
INTRAMUSCULAR | Status: AC
  Filled 2019-02-18: qty 50

## 2019-02-18 MED ORDER — SODIUM CHLORIDE 0.9% FLUSH: 3.0000 mL | Freq: Once | INTRAVENOUS | Status: DC

## 2019-02-18 MED ORDER — SODIUM CHLORIDE 0.9 % IV BOLUS
1000.0000 mL | Freq: Once | INTRAVENOUS | Status: AC
  Administered 2019-02-18: 1000 mL via INTRAVENOUS

## 2019-02-18 MED ORDER — PANTOPRAZOLE SODIUM 40 MG IV SOLR
40.0000 mg | Freq: Once | INTRAVENOUS | Status: AC
  Administered 2019-02-18: 40 mg via INTRAVENOUS
  Filled 2019-02-18: qty 40

## 2019-02-18 MED ORDER — ACETAMINOPHEN 325 MG PO TABS
650.0000 mg | ORAL_TABLET | Freq: Once | ORAL | Status: AC
  Administered 2019-02-18: 650 mg via ORAL
  Filled 2019-02-18: qty 2

## 2019-02-18 NOTE — ED Provider Notes (Signed)
[facility] DEPT Provider Note   CSN: [id number] Arrival date & time: 02/18/19  1300    History   Chief Complaint Chief Complaint  Patient presents with  . Abdominal Pain  . Weakness  . Melena    HPI Ruben Peterson Sr. is a 58 y.o. male.     HPI  Patient is a 58 year old male with a history of hypertension, avascular necrosis of the left hip, status post left total hip arthroplasty on 02-08-2019 presenting for generalized weakness, black stools over the last 5 days, vomiting, and abdominal pain.  Patient reports that he was prescribed Xarelto and aspirin after his total hip arthroplasty to prevent DVT.  He called the orthopedic office 5 days ago when he had his first dark stool, and was told to discontinue his Xarelto.  Patient reports he continued to have 1-2 dark stools per day.  He also reports he has had vomiting at least once per day over the past 5 days, nonbilious and nonbloody.  No coffee-ground emesis.  Patient reports he has had progressively worsening periumbilical dull progressing to sharp pain over the last 5 days.  Patient denies any history of ulcers or GI bleeding.  Last colonoscopy within the last 2 years, however he is unsure of the results or who performed it.  No abdominal surgical history.  Patient denies any alcohol use for 20 years, denies any regular NSAID use.  Patient denies any iron therapy or Pepto-Bismol use.  Past Medical History:  Diagnosis Date  . Arthritis   . Complication of anesthesia    pt. states he remember being intubated at [facility] and complains of  food feeling stuck at times  . GERD (gastroesophageal reflux disease)    diet related  . Hypertension     Patient Active Problem List   Diagnosis Date Noted  . Abnormal ECG 02/09/2019  . Tachycardia 02/09/2019  . Aseptic necrosis of bone of hip, left ([place]) 02/08/2019  . Osteonecrosis of hip ([place]) 02/08/2019    Past Surgical History:  Procedure Laterality Date   . FRACTURE SURGERY     left  x3  with bone graft and screw  . jaw wired shu from fracture    . toes stretched left foot    . TOTAL HIP ARTHROPLASTY Left 02/08/2019   Procedure: CONVERSION OF PREVIOUS HIP SURGERY TO TOTAL HIP ARTHROPLASTY (Posterior);  Surgeon: Gaynelle Arabian, MD;  Location: [facility] ORS;  Service: Orthopedics;  Laterality: Left;  148min (please move other cases down 66min for Dr. Wynelle Link)        Home Medications    Prior to Admission medications   Medication Sig Start Date End Date Taking? Authorizing Provider  acetaminophen (TYLENOL) 500 MG tablet Take 1,000 mg by mouth daily as needed for moderate pain.   Yes [provider]  aspirin EC 81 MG tablet Take 81 mg by mouth daily.   Yes [provider]  chlorthalidone (HYGROTON) 25 MG tablet Take 25 mg by mouth daily.   Yes [provider]  ondansetron (ZOFRAN) 4 MG tablet Take 4 mg by mouth every 8 (eight) hours as needed for nausea or vomiting. for nausea 02/15/19  Yes [provider]  oxymetazoline (AFRIN) 0.05 % nasal spray Place 1 spray into both nostrils 2 (two) times daily as needed for congestion.   Yes [provider]  traMADol (ULTRAM) 50 MG tablet Take 50 mg by mouth every 6 (six) hours as needed (pain).   Yes [provider]  HYDROcodone-acetaminophen (NORCO) 7.5-325 MG tablet Take 1-2 tablets by mouth every 6 (six) hours as needed for severe pain (pain score 7-10). Patient not taking: Reported on 02/18/2019 02/10/19   Edmisten, Drue Dun L, PA  methocarbamol (ROBAXIN) 500 MG tablet Take 1 tablet (500 mg total) by mouth every 6 (six) hours as needed for muscle spasms. Patient not taking: Reported on 02/18/2019 02/10/19   Edmisten, Ok Anis, PA  rivaroxaban (XARELTO) 10 MG TABS tablet Take 1 tablet (10 mg total) by mouth daily with breakfast for 19 days. Then take one 81 mg aspirin once a day for three weeks. Then discontinue aspirin. 02/10/19 03/01/19  Edmisten, Ok Anis, PA      Family History Family History  Problem Relation Age of Onset  . CAD Mother        Cardiac stent at age 20  . Cancer Neg Hx   . Hypertension Neg Hx     Social History Social History   Tobacco Use  . Smoking status: Former Smoker    Years: 10.00  . Smokeless tobacco: Never Used  . Tobacco comment: quit  25 years   Substance Use Topics  . Alcohol use: Not Currently    Frequency: Never  . Drug use: Never     Allergies   Benazepril and Heparin   Review of Systems Review of Systems  Constitutional: Negative for chills and fever.  HENT: Negative for congestion and sore throat.   Eyes: Negative for visual disturbance.  Respiratory: Negative for cough, chest tightness and shortness of breath.   Cardiovascular: Negative for chest pain, palpitations and leg swelling.  Gastrointestinal: Positive for abdominal pain, nausea and vomiting. Negative for diarrhea.       +Dark stools  Genitourinary: Negative for dysuria and flank pain.  Musculoskeletal: Negative for back pain and myalgias.  Skin: Negative for rash.  Neurological: Positive for weakness. Negative for dizziness and syncope.       +Generalized weakness     Physical Exam Updated Vital Signs BP 118/88   Pulse 86   Temp 98 F (36.7 C) (Oral)   Resp 16   SpO2 100%   Physical Exam Vitals signs and nursing note reviewed.  Constitutional:      General: He is not in acute distress.    Appearance: He is well-developed.  HENT:     Head: Normocephalic and atraumatic.  Eyes:     Conjunctiva/sclera: Conjunctivae normal.     Pupils: Pupils are equal, round, and reactive to light.  Neck:     Musculoskeletal: Normal range of motion and neck supple.  Cardiovascular:     Rate and Rhythm: Normal rate and regular rhythm.     Heart sounds: S1 normal and S2 normal. No murmur.  Pulmonary:     Effort: Pulmonary effort is normal.     Breath sounds: Normal breath sounds. No wheezing or rales.  Abdominal:     General:  Bowel sounds are increased. There is no distension.     Palpations: Abdomen is soft.     Tenderness: There is abdominal tenderness in the epigastric area and periumbilical area. There is no guarding.  Genitourinary:    Comments: Rectal examination performed with RN chaperone present.  Normal rectal tone.  Patient has minimal stool present in the rectal vault.  The stool that is present is brown.  No melena, maroon stool or hematochezia. Musculoskeletal: Normal range of motion.        General: No deformity.  Lymphadenopathy:  Cervical: No cervical adenopathy.  Skin:    General: Skin is warm and dry.     Findings: No erythema or rash.  Neurological:     Mental Status: He is alert.     Comments: Cranial nerves grossly intact. Patient moves extremities symmetrically and with good coordination.  Psychiatric:        Behavior: Behavior normal.        Thought Content: Thought content normal.        Judgment: Judgment normal.      ED Treatments / Results  Labs (all labs ordered are listed, but only abnormal results are displayed) Labs Reviewed  LIPASE, BLOOD - Abnormal; Notable for the following components:      Result Value   Lipase 64 (*)    All other components within normal limits  COMPREHENSIVE METABOLIC PANEL - Abnormal; Notable for the following components:   Sodium 132 (*)    Potassium 3.4 (*)    Chloride 93 (*)    Glucose, Bld 107 (*)    Total Protein 8.4 (*)    All other components within normal limits  CBC - Abnormal; Notable for the following components:   Hemoglobin 12.9 (*)    All other components within normal limits  URINALYSIS, ROUTINE W REFLEX MICROSCOPIC - Abnormal; Notable for the following components:   APPearance HAZY (*)    pH 9.0 (*)    Ketones, ur 5 (*)    All other components within normal limits  POC OCCULT BLOOD, ED - Abnormal; Notable for the following components:   Fecal Occult Bld POSITIVE (*)    All other components within normal limits    PROTIME-INR  APTT  TROPONIN I  TYPE AND SCREEN    EKG EKG Interpretation  Date/Time:  Saturday February 18 2019 13:54:54 EST Ventricular Rate:  89 PR Interval:    QRS Duration: 89 QT Interval:  371 QTC Calculation: 452 R Axis:   -29 Text Interpretation:  Sinus rhythm Borderline left axis deviation Borderline T wave abnormalities No significant change since last tracing Confirmed by Deno Etienne [phone]) on 02/18/2019 4:06:50 PM   Radiology Dg Chest 1 View  Result Date: 02/18/2019 CLINICAL DATA:  Nausea, weakness EXAM: CHEST  1 VIEW COMPARISON:  02/08/2019 FINDINGS: The heart size and mediastinal contours are within normal limits. Both lungs are clear. The visualized skeletal structures are unremarkable. IMPRESSION: No acute abnormality of the lungs in AP portable projection. Electronically Signed   By: Eddie Candle M.D.   On: 02/18/2019 15:34   Ct Abdomen Pelvis W Contrast  Result Date: 02/18/2019 CLINICAL DATA:  Total hip replacement, blood in stool, blood thinner EXAM: CT ABDOMEN AND PELVIS WITH CONTRAST TECHNIQUE: Multidetector CT imaging of the abdomen and pelvis was performed using the standard protocol following bolus administration of intravenous contrast. CONTRAST:  186mL ISOVUE-300 IOPAMIDOL (ISOVUE-300) INJECTION 61% COMPARISON:  None. FINDINGS: Lower chest: No acute abnormality. Hepatobiliary: There is a large subcapsular lesion of the left lobe of the liver with peripheral nodular enhancement and continued enhancement on delayed phase imaging measuring at least 7.0 x 6.2 x 5.7 cm. No gallstones, gallbladder wall thickening, or biliary dilatation. Pancreas: Unremarkable. No pancreatic ductal dilatation or surrounding inflammatory changes. Spleen: Normal in size without focal abnormality. Adrenals/Urinary Tract: Adrenal glands are unremarkable. Kidneys are normal, without renal calculi, focal lesion, or hydronephrosis. Bladder is unremarkable. Stomach/Bowel: Stomach is within normal  limits. Appendix appears normal. No evidence of bowel wall thickening, distention, or inflammatory changes. The distal  colon and rectum are fluid filled. Vascular/Lymphatic: Calcific atherosclerosis. No enlarged abdominal or pelvic lymph nodes. Reproductive: No mass or other abnormality. Other: No abdominal wall hernia or abnormality. No abdominopelvic ascites. Musculoskeletal: Status post left hip total arthroplasty with a subcutaneous fluid collection overlying the lateral left hip measuring 11.9 by 3.4 x 3.3 cm. IMPRESSION: 1. No definite CT findings to explain blood in stool. The distal colon and rectum are fluid filled. There is no nidus of bleeding identified. 2. Large subcapsular lesion in the left lobe of the liver measuring 7.0 cm, generally consistent with giant hemangioma although incompletely characterized. Consider nonemergent multiphasic contrast enhanced MRI and surgical referral given general risk of enlargement and hemorrhage. 3. Status post left hip total arthroplasty with a subcutaneous fluid collection overlying the lateral left hip measuring 11.9 by 3.4 x 3.3 cm, likely hematoma or seroma in the recent postoperative setting. Electronically Signed   By: Eddie Candle M.D.   On: 02/18/2019 16:09    Procedures Procedures (including critical care time)  Medications Ordered in ED Medications  sodium chloride flush (NS) 0.9 % injection 3 mL (3 mLs Intravenous Not Given 02/18/19 1429)  sodium chloride 0.9 % bolus 1,000 mL (1,000 mLs Intravenous New Bag/Given 02/18/19 1416)    And  0.9 %  sodium chloride infusion ( Intravenous New Bag/Given 02/18/19 1555)  pantoprazole (PROTONIX) injection 40 mg (40 mg Intravenous Given 02/18/19 1416)  iopamidol (ISOVUE-300) 61 % injection (  Contrast Given 02/18/19 1633)  iopamidol (ISOVUE-300) 61 % injection 100 mL (100 mLs Intravenous Contrast Given 02/18/19 1529)  acetaminophen (TYLENOL) tablet 650 mg (650 mg Oral Given 02/18/19 1644)     Initial Impression /  Assessment and Plan / ED Course  I have reviewed the triage vital signs and the nursing notes.  Pertinent labs & imaging results that were available during my care of the patient were reviewed by me and considered in my medical decision making (see chart for details).  Clinical Course as of Feb 18 1831  Sat Feb 18, 2019  1435 Stable. No melena. Do not feel pt needs immediate transfusion at this time.   Hemoglobin(!): 12.9 [AM]  1455 Likely due to poor PO intake.   Lipase(!): 64 [AM]  1639 Patient feels better, no abdominal pain currently.   [AM]  1802 Patient p.o. gently without difficulty.  Reports complete resolution of his pain.   [AM]  1831 Pulse 98 on my discharge assessment.   Pulse Rate(!): 104 [AM]    Clinical Course User Index [AM] Albesa Seen, PA-C       Patient is nontoxic-appearing, hemodynamically stable, and has a nonsurgical abdomen.  Differential gnosis includes GI bleeding from peptic ulcer disease, diverticular bleed, ileus with GI bleeding.  Patient has no prior imaging to assess the size of his aorta, however he is felt overall to be at low risk for AAA, and has intact distal pulses with former smoking history.  There is no evidence of melena on exam, patient's hemoglobin is improved from prior at 12.9.  Do not feel that transfusion necessary at this time.  Will obtain CT scan.  CT scan is demonstrating large likely hemangioma approximately 7 cm in the left lobe of the liver.  This was discussed with Ruben Peterson of general surgery who states that if it is a hemangioma, this area is not a surgical issue, patient can follow-up as an outpatient for further imaging.  I appreciate his involvement in the care of this patient.  Will refer to GI. Patient's hemoglobin is stable and improved from his presurgical hemoglobin at 12.9.  Patient did visit [facility] rocking him 3 days ago and was discharged for normal hemoglobin.  He can stand on hemoglobin today.  No elevation of BUN.   Patient's fecal occult blood is positive.  He will need close follow-up with his gastroenterologist, and has previously seen Dr. Michail Sermon.   Patient be discharged with Zofran, Protonix, and Carafate.  Patient was given explicit return precautions for any persistent dark stools, chest pain, shortness of breath, palpitations, weakness, syncope or presyncope.  Patient is in understanding and agrees with the plan of care.  This is a shared visit with Dr. Deno Etienne. Patient was independently evaluated by this attending physician and deemed stable for outpatient management. Attending physician consulted in evaluation and discharge management.  Final Clinical Impressions(s) / ED Diagnoses   Final diagnoses:  Epigastric pain  Fecal occult blood test positive  Liver hemangioma    ED Discharge Orders         Ordered    pantoprazole (PROTONIX) 40 MG tablet  Daily     02/18/19 1808    sucralfate (CARAFATE) 1 g tablet  3 times daily with meals & bedtime     02/18/19 1808    ondansetron (ZOFRAN-ODT) 4 MG disintegrating tablet  Every 8 hours PRN     02/18/19 1808           Albesa Seen, PA-C 02/18/19 1834    Tegeler, Gwenyth Allegra, MD 02/19/19 1236

## 2019-02-18 NOTE — ED Triage Notes (Signed)
Patient arrived via POV.  Patient underwent surgery last Wednesday (1 week 3 days ago) (Total hip replacement) sent home last Friday Friday.   Patient started feeling nauseas, black stools, weakness, hot flashes, and mid abdominal pain that started last Saturday (a week ago)   Patients wife called surgeon to let him know about sx and was told to stop the blood thinner.   A/Ox4 Wheelchair in triage.

## 2019-02-18 NOTE — Discharge Instructions (Addendum)
Please see the information and instructions below regarding your visit.  Your diagnoses today include:  1. Fecal occult blood test positive   2. Epigastric pain   3. Liver hemangioma     Tests performed today include: See side panel of your discharge paperwork for testing performed today. Vital signs are listed at the bottom of these instructions.   Medications prescribed:    Take any prescribed medications only as prescribed, and any over the counter medications only as directed on the packaging.  Please start taking medication call Protonix.  Please take it first thing in the morning 30 minutes before you eat any other food.  This will help with absorption.  Please start taking a medicine called Carafate or sucralfate.  This can be taken up to 4 times a day, with meals and before bedtime.  Please do not take your proton pump inhibitor (Protonix, Nexium, Prilosec) within 30 minutes of taking Carafate.  You are prescribed Zofran as needed for nausea.  Please take every 8 hours as needed for nausea and vomiting.  Home care instructions:  Please follow any educational materials contained in this packet.   Follow-up instructions: Please follow-up with Dr. Michail Sermon of gastroenterology as soon as possible for discussion of the change in color of stools, as well as the hemangioma on the liver.  Return instructions:  Please return to the Emergency Department if you experience worsening symptoms.  Please return the emergency department immediately for any persistent tarry colored stools, increasing tarry colored stools, chest pain, shortness of breath, feeling that you are going to pass out or passing out spells. Please return if you have any other emergent concerns.  Additional Information:   Your vital signs today were: BP 113/82 (BP Location: Right Arm)    Pulse (!) 104    Temp 98 F (36.7 C) (Oral)    Resp 19    SpO2 100%  If your blood pressure (BP) was elevated on multiple  readings during this visit above 130 for the top number or above 80 for the bottom number, please have this repeated by your primary care provider within one month. --------------  Thank you for allowing [place] to participate in your care today.

## 2020-05-13 IMAGING — DX DG PORTABLE PELVIS
1 series · 1 of 1 positions shown · non-contrast
Comparison: 01/19/2018

CLINICAL DATA: Left hip replacement, postoperative assessment

EXAM:
PORTABLE PELVIS 1-2 VIEWS

[pelvis ap]
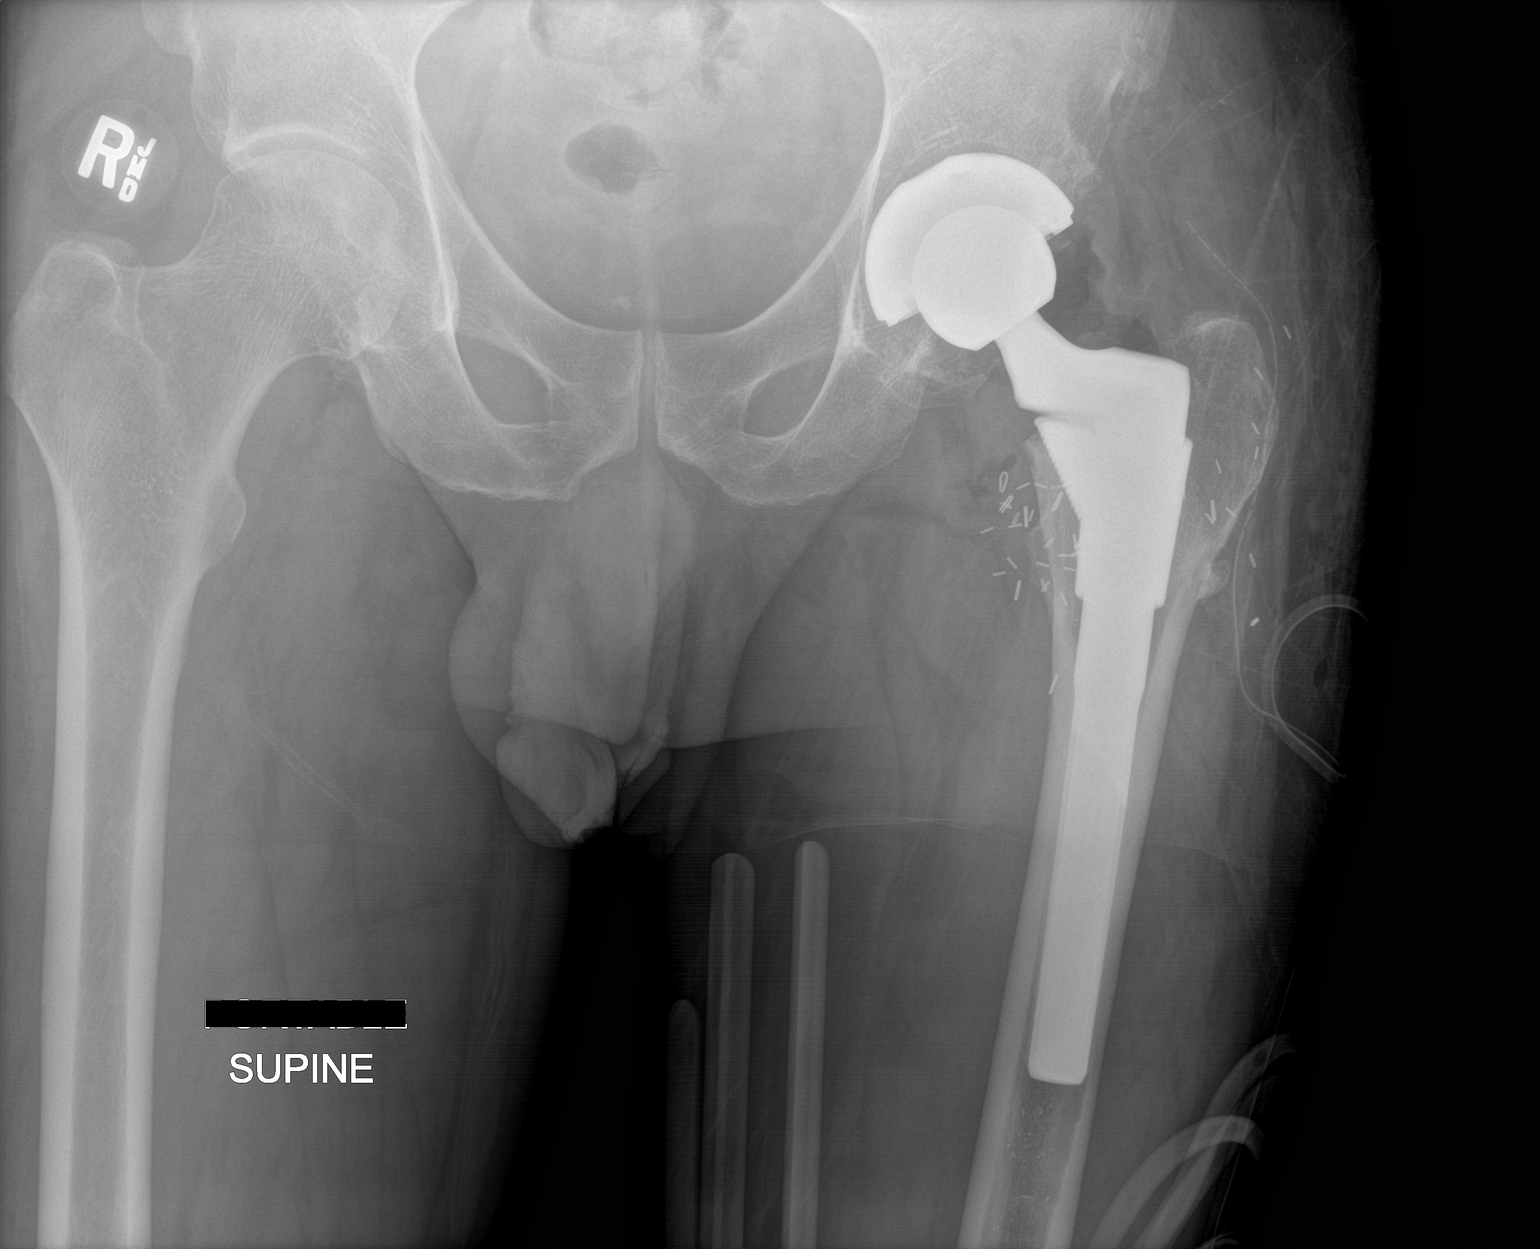

[1 of 1 positions shown; findings below may reference images not displayed]

FINDINGS: A low centered frontal projection of the pelvis includes the entire
a left hip prosthesis which appears well positioned an without
periprosthetic fracture or immediate complicating feature. A drain
is in place along with expected gas in the soft tissues.
IMPRESSION: 1. Left total hip prosthesis placement without early complicating
feature identified.

## 2020-05-23 IMAGING — CT CT ABD-PELV W/ CM
2 of 5 series · 16 of 46 positions shown, 18 images · IV contrast (ISOVUE)
Comparison: None.

CLINICAL DATA: Total hip replacement, blood in stool, blood thinner

EXAM:
CT ABDOMEN AND PELVIS WITH CONTRAST
TECHNIQUE: Multidetector CT imaging of the abdomen and pelvis was performed
using the standard protocol following bolus administration of
intravenous contrast.
CONTRAST:  100mL CD797U-2DD IOPAMIDOL (CD797U-2DD) INJECTION 61%

[Series 2: axial st · axial · 0.81mm/px · z∈[+971,+1386]mm · 13 of 95 slices shown, 15 images]
[im 6/95  soft-tissue]
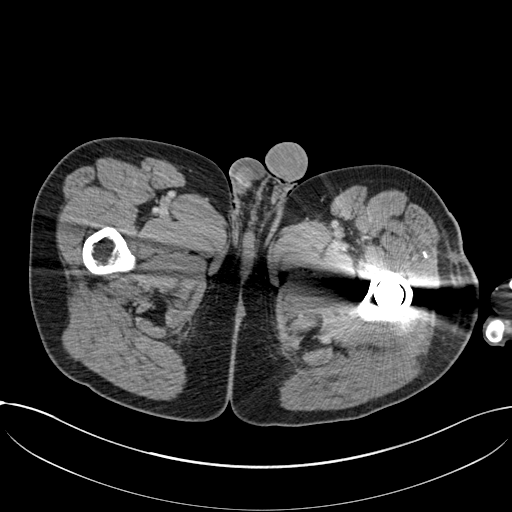
[im 6/95  bone]
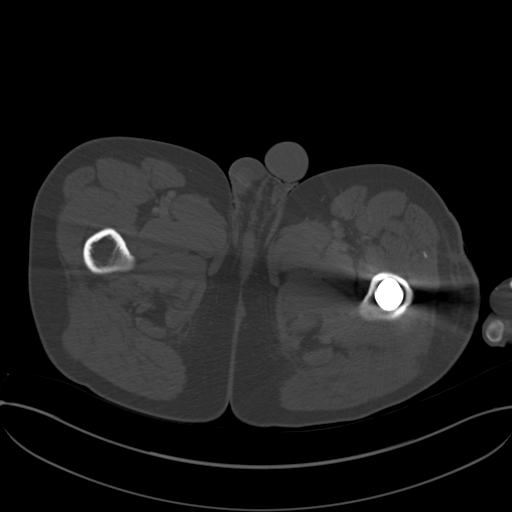
[im 12/95  soft-tissue]
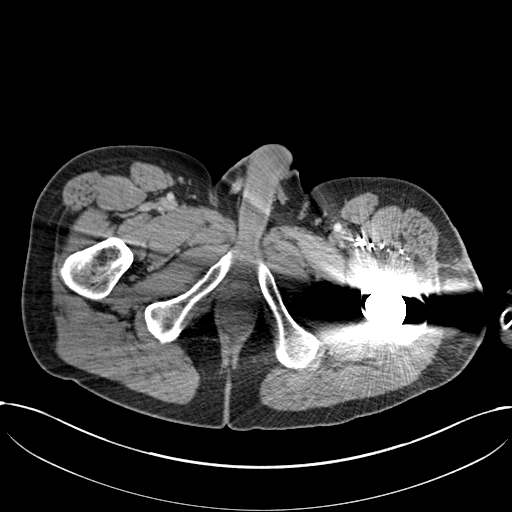
[im 23/95  soft-tissue]
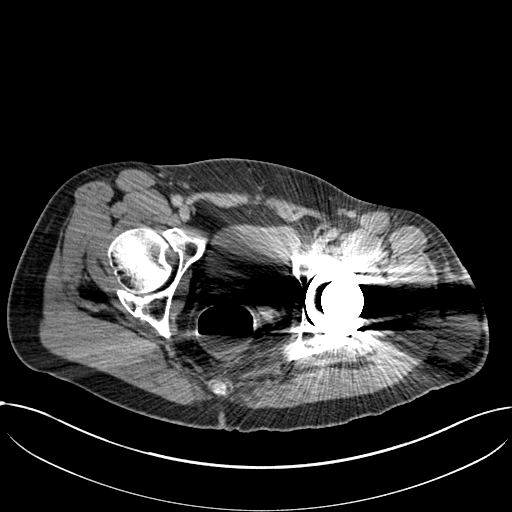
[im 28/95  soft-tissue]
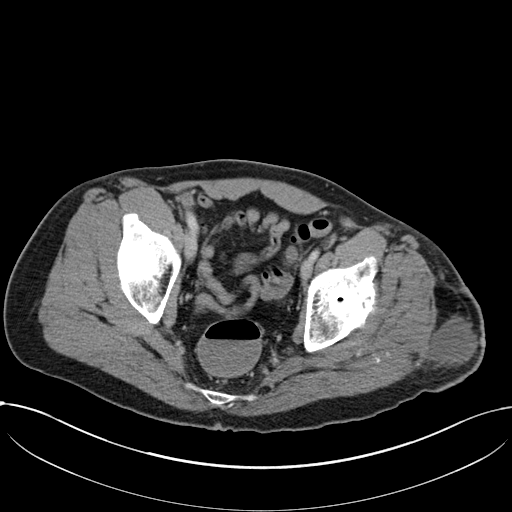
[im 34/95  soft-tissue]
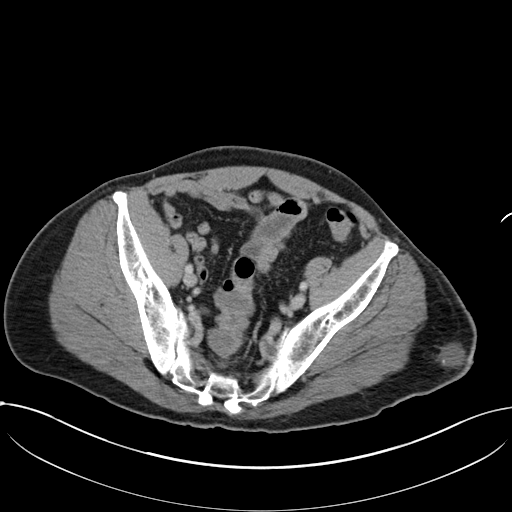
[im 39/95  soft-tissue]
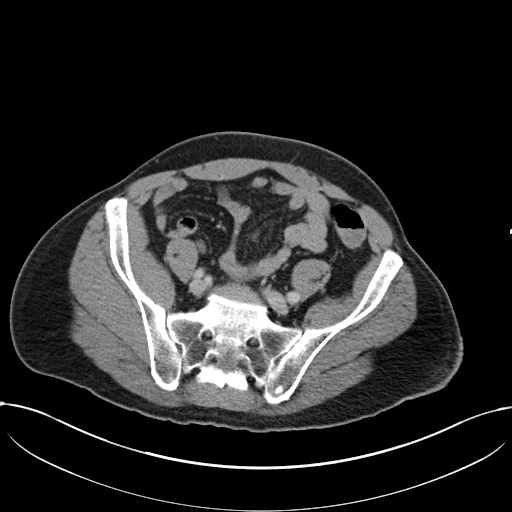
[im 50/95  soft-tissue]
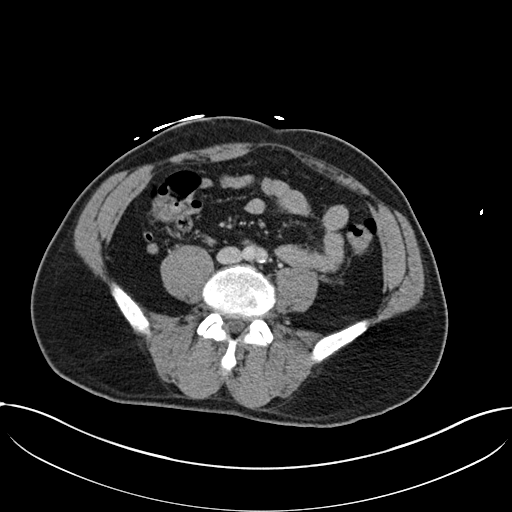
[im 56/95  soft-tissue]
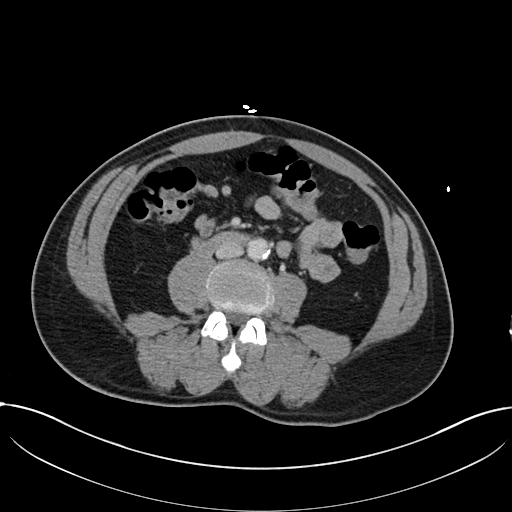
[im 61/95  soft-tissue]
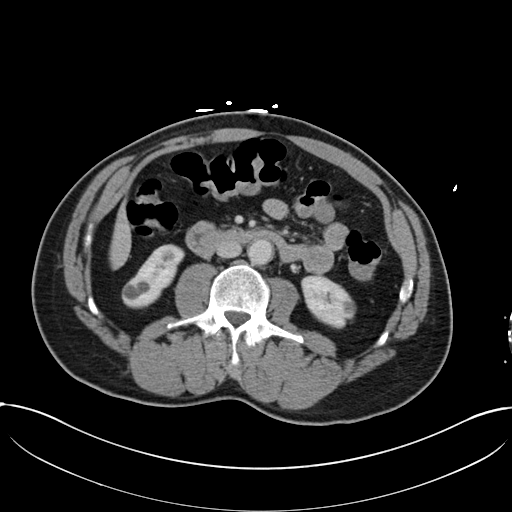
[im 61/95  bone]
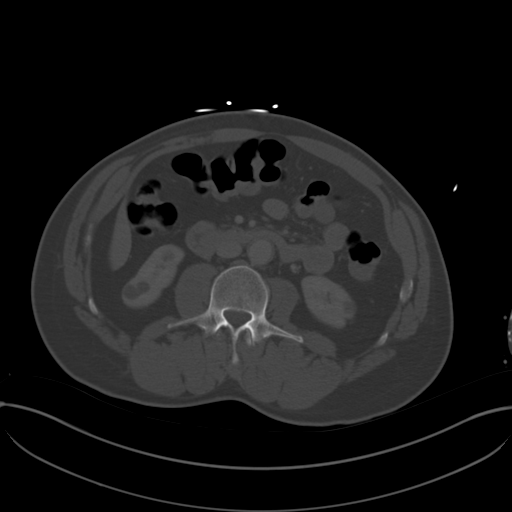
[im 67/95  soft-tissue]
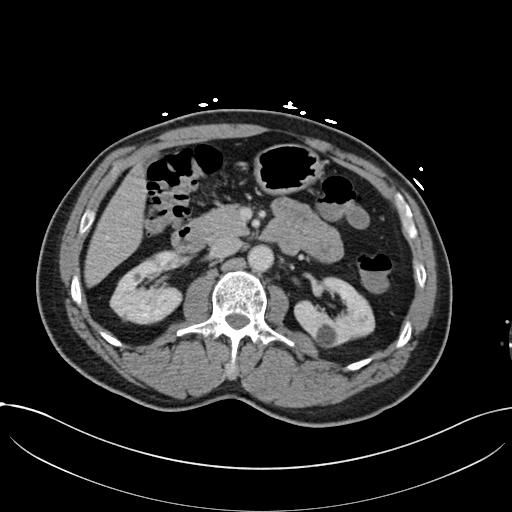
[im 72/95  soft-tissue]
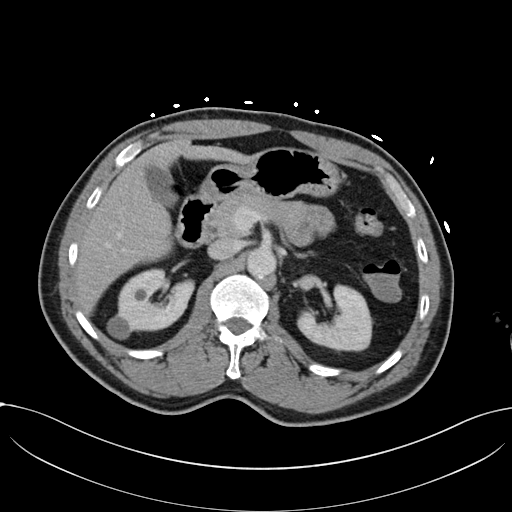
[im 83/95  soft-tissue]
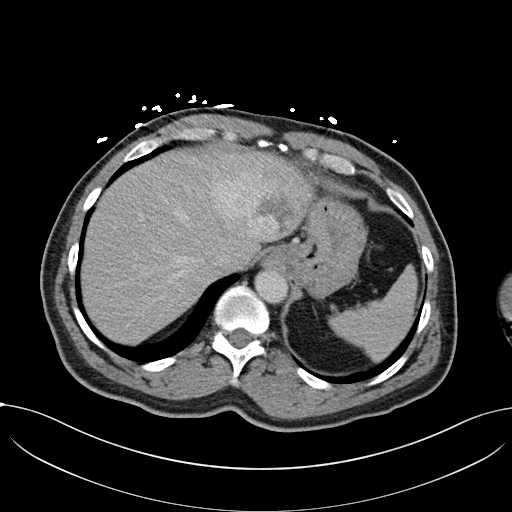
[im 89/95  soft-tissue]
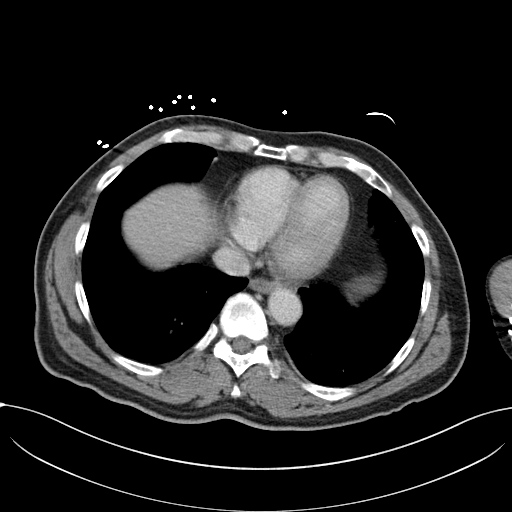

[Series 5: coronal st · coronal · 0.89mm/px · 3 of 142 slices shown]
[im 48/142  soft-tissue]
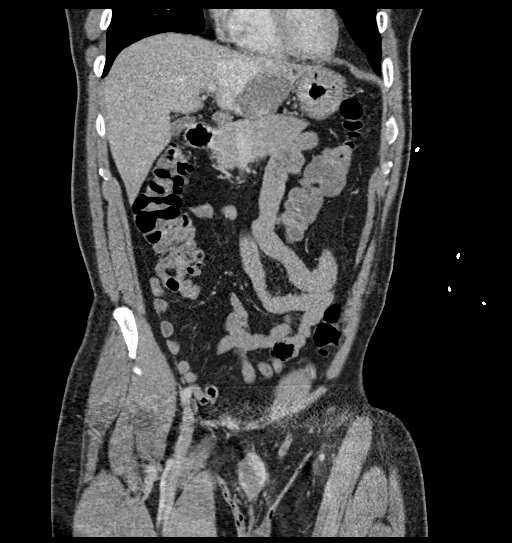
[im 63/142  soft-tissue]
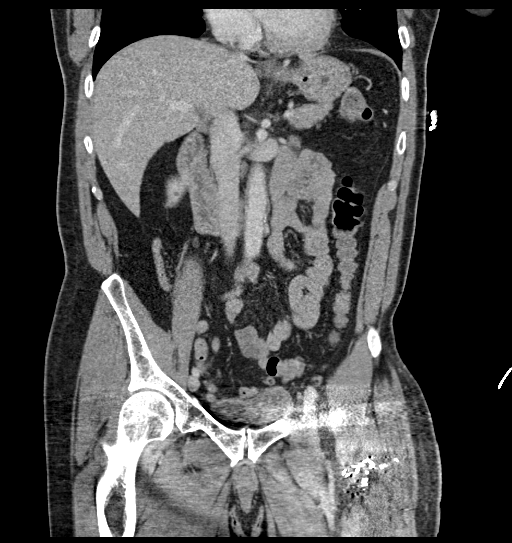
[im 79/142  soft-tissue]
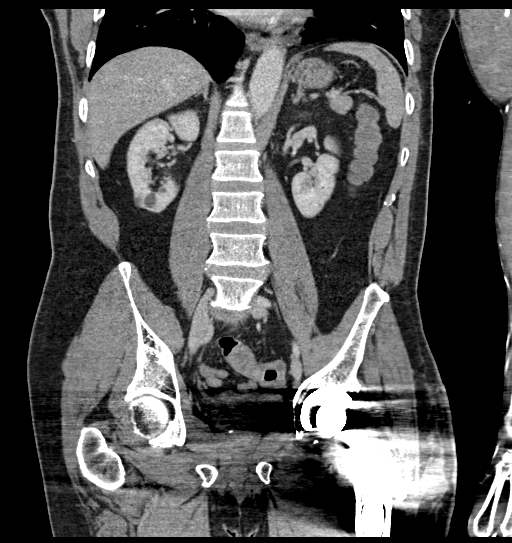

[16 of 46 positions shown; findings below may reference images not displayed]

FINDINGS: Lower chest: No acute abnormality.

Hepatobiliary: There is a large subcapsular lesion of the left lobe
of the liver with peripheral nodular enhancement and continued
enhancement on delayed phase imaging measuring at least 7.0 x 6.2 x
5.7 cm. No gallstones, gallbladder wall thickening, or biliary
dilatation.

Pancreas: Unremarkable. No pancreatic ductal dilatation or
surrounding inflammatory changes.

Spleen: Normal in size without focal abnormality.

Adrenals/Urinary Tract: Adrenal glands are unremarkable. Kidneys are
normal, without renal calculi, focal lesion, or hydronephrosis.
Bladder is unremarkable.

Stomach/Bowel: Stomach is within normal limits. Appendix appears
normal. No evidence of bowel wall thickening, distention, or
inflammatory changes. The distal colon and rectum are fluid filled.

Vascular/Lymphatic: Calcific atherosclerosis. No enlarged abdominal
or pelvic lymph nodes.

Reproductive: No mass or other abnormality.

Other: No abdominal wall hernia or abnormality. No abdominopelvic
ascites.

Musculoskeletal: Status post left hip total arthroplasty with a
subcutaneous fluid collection overlying the lateral left hip
measuring 11.9 by 3.4 x 3.3 cm.
IMPRESSION: 1. No definite CT findings to explain blood in stool. The distal
colon and rectum are fluid filled. There is no nidus of bleeding
identified.
2. Large subcapsular lesion in the left lobe of the liver measuring
7.0 cm, generally consistent with giant hemangioma although
incompletely characterized. Consider nonemergent multiphasic
contrast enhanced MRI and surgical referral given general risk of
enlargement and hemorrhage.
3. Status post left hip total arthroplasty with a subcutaneous fluid
collection overlying the lateral left hip measuring 11.9 by 3.4 x
3.3 cm, likely hematoma or seroma in the recent postoperative
setting.

## 2021-12-19 DIAGNOSIS — Z125 Encounter for screening for malignant neoplasm of prostate: Secondary | ICD-10-CM | POA: Diagnosis not present

## 2021-12-19 DIAGNOSIS — I10 Essential (primary) hypertension: Secondary | ICD-10-CM | POA: Diagnosis not present

## 2021-12-19 DIAGNOSIS — N400 Enlarged prostate without lower urinary tract symptoms: Secondary | ICD-10-CM | POA: Diagnosis not present

## 2021-12-19 DIAGNOSIS — J309 Allergic rhinitis, unspecified: Secondary | ICD-10-CM | POA: Diagnosis not present

## 2022-03-23 DIAGNOSIS — N400 Enlarged prostate without lower urinary tract symptoms: Secondary | ICD-10-CM | POA: Diagnosis not present

## 2022-03-23 DIAGNOSIS — I10 Essential (primary) hypertension: Secondary | ICD-10-CM | POA: Diagnosis not present

## 2022-03-23 DIAGNOSIS — J309 Allergic rhinitis, unspecified: Secondary | ICD-10-CM | POA: Diagnosis not present

## 2022-03-23 DIAGNOSIS — Z6827 Body mass index (BMI) 27.0-27.9, adult: Secondary | ICD-10-CM | POA: Diagnosis not present

## 2022-07-23 DIAGNOSIS — N400 Enlarged prostate without lower urinary tract symptoms: Secondary | ICD-10-CM | POA: Diagnosis not present

## 2022-07-23 DIAGNOSIS — I10 Essential (primary) hypertension: Secondary | ICD-10-CM | POA: Diagnosis not present

## 2022-07-23 DIAGNOSIS — J309 Allergic rhinitis, unspecified: Secondary | ICD-10-CM | POA: Diagnosis not present

## 2022-07-23 DIAGNOSIS — Z6827 Body mass index (BMI) 27.0-27.9, adult: Secondary | ICD-10-CM | POA: Diagnosis not present
